# Patient Record
Sex: Female | Born: 1965 | Race: Black or African American | Hispanic: Refuse to answer | Marital: Married | Smoking: Never smoker
Health system: Southern US, Community
[De-identification: ages and names within clinical notes are randomized; demographics above are authoritative.]

## PROBLEM LIST (undated history)

## (undated) DIAGNOSIS — N938 Other specified abnormal uterine and vaginal bleeding: Secondary | ICD-10-CM

## (undated) DIAGNOSIS — Z8742 Personal history of other diseases of the female genital tract: Secondary | ICD-10-CM

## (undated) DIAGNOSIS — M545 Low back pain, unspecified: Secondary | ICD-10-CM

## (undated) DIAGNOSIS — Z8619 Personal history of other infectious and parasitic diseases: Secondary | ICD-10-CM

## (undated) DIAGNOSIS — N926 Irregular menstruation, unspecified: Secondary | ICD-10-CM

## (undated) DIAGNOSIS — N83201 Unspecified ovarian cyst, right side: Secondary | ICD-10-CM

## (undated) HISTORY — DX: Unspecified ovarian cyst, right side: N83.201

## (undated) HISTORY — DX: Personal history of other diseases of the female genital tract: Z87.42

## (undated) HISTORY — DX: Low back pain: M54.5

## (undated) HISTORY — DX: Personal history of other infectious and parasitic diseases: Z86.19

## (undated) HISTORY — DX: Low back pain, unspecified: M54.50

## (undated) HISTORY — PX: OTHER SURGICAL HISTORY: SHX169

## (undated) HISTORY — DX: Other specified abnormal uterine and vaginal bleeding: N93.8

## (undated) HISTORY — DX: Irregular menstruation, unspecified: N92.6

---

## 2009-02-27 ENCOUNTER — Ambulatory Visit: Payer: Self-pay | Admitting: Internal Medicine

## 2009-02-27 DIAGNOSIS — M545 Low back pain: Secondary | ICD-10-CM | POA: Insufficient documentation

## 2009-02-27 DIAGNOSIS — J309 Allergic rhinitis, unspecified: Secondary | ICD-10-CM | POA: Insufficient documentation

## 2009-03-03 ENCOUNTER — Ambulatory Visit: Payer: Self-pay | Admitting: Internal Medicine

## 2009-03-08 LAB — CONVERTED CEMR LAB
ALT: 18 units/L (ref 0–35)
AST: 21 units/L (ref 0–37)
Albumin: 3.9 g/dL (ref 3.5–5.2)
BUN: 12 mg/dL (ref 6–23)
Bilirubin Urine: NEGATIVE
Bilirubin, Direct: 0.1 mg/dL (ref 0.0–0.3)
CO2: 29 meq/L (ref 19–32)
Calcium: 9.3 mg/dL (ref 8.4–10.5)
Chloride: 104 meq/L (ref 96–112)
Cholesterol: 170 mg/dL (ref 0–200)
GFR calc Af Amer: 70 mL/min
GFR calc non Af Amer: 58 mL/min
HCT: 39 % (ref 36.0–46.0)
Ketones, ur: NEGATIVE mg/dL
Lymphocytes Relative: 45.4 % (ref 12.0–46.0)
MCHC: 33.9 g/dL (ref 30.0–36.0)
MCV: 90.7 fL (ref 78.0–100.0)
Neutro Abs: 2.9 10*3/uL (ref 1.4–7.7)
Neutrophils Relative %: 49.6 % (ref 43.0–77.0)
Potassium: 4 meq/L (ref 3.5–5.1)
Sodium: 139 meq/L (ref 135–145)
Total Bilirubin: 0.5 mg/dL (ref 0.3–1.2)
Total CHOL/HDL Ratio: 2.2
Total Protein: 6.7 g/dL (ref 6.0–8.3)
Triglycerides: 29 mg/dL (ref 0–149)
Urobilinogen, UA: 0.2 (ref 0.0–1.0)
VLDL: 6 mg/dL (ref 0–40)
pH: 7.5 (ref 5.0–8.0)

## 2009-04-20 ENCOUNTER — Telehealth: Payer: Self-pay | Admitting: Internal Medicine

## 2009-04-21 ENCOUNTER — Ambulatory Visit: Payer: Self-pay | Admitting: Internal Medicine

## 2009-04-21 DIAGNOSIS — R079 Chest pain, unspecified: Secondary | ICD-10-CM

## 2009-07-25 ENCOUNTER — Telehealth (INDEPENDENT_AMBULATORY_CARE_PROVIDER_SITE_OTHER): Payer: Self-pay | Admitting: *Deleted

## 2009-11-10 ENCOUNTER — Ambulatory Visit: Payer: Self-pay | Admitting: Internal Medicine

## 2009-11-10 DIAGNOSIS — N390 Urinary tract infection, site not specified: Secondary | ICD-10-CM

## 2009-11-13 LAB — CONVERTED CEMR LAB
Bilirubin Urine: NEGATIVE
Nitrite: POSITIVE
Urine Glucose: NEGATIVE mg/dL
Urobilinogen, UA: 0.2 (ref 0.0–1.0)
pH: 7 (ref 5.0–8.0)

## 2009-12-30 DIAGNOSIS — N938 Other specified abnormal uterine and vaginal bleeding: Secondary | ICD-10-CM

## 2009-12-30 DIAGNOSIS — N83201 Unspecified ovarian cyst, right side: Secondary | ICD-10-CM

## 2009-12-30 DIAGNOSIS — N926 Irregular menstruation, unspecified: Secondary | ICD-10-CM

## 2009-12-30 HISTORY — DX: Other specified abnormal uterine and vaginal bleeding: N93.8

## 2009-12-30 HISTORY — DX: Unspecified ovarian cyst, right side: N83.201

## 2009-12-30 HISTORY — DX: Irregular menstruation, unspecified: N92.6

## 2010-05-25 ENCOUNTER — Ambulatory Visit: Payer: Self-pay | Admitting: Internal Medicine

## 2010-05-25 LAB — CONVERTED CEMR LAB
AST: 24 units/L (ref 0–37)
Alkaline Phosphatase: 54 units/L (ref 39–117)
BUN: 14 mg/dL (ref 6–23)
Basophils Relative: 0.5 % (ref 0.0–3.0)
Bilirubin Urine: NEGATIVE
Bilirubin, Direct: 0.1 mg/dL (ref 0.0–0.3)
Creatinine, Ser: 1 mg/dL (ref 0.4–1.2)
Eosinophils Relative: 0.9 % (ref 0.0–5.0)
Glucose, Bld: 84 mg/dL (ref 70–99)
HCT: 36.2 % (ref 36.0–46.0)
Hemoglobin, Urine: NEGATIVE
Lymphs Abs: 2 10*3/uL (ref 0.7–4.0)
MCV: 90.2 fL (ref 78.0–100.0)
Neutro Abs: 3.9 10*3/uL (ref 1.4–7.7)
Nitrite: NEGATIVE
Platelets: 210 10*3/uL (ref 150.0–400.0)
Potassium: 4.8 meq/L (ref 3.5–5.1)
RBC: 4.01 M/uL (ref 3.87–5.11)
Sodium: 140 meq/L (ref 135–145)
Specific Gravity, Urine: 1.015 (ref 1.000–1.030)
TSH: 1.83 microintl units/mL (ref 0.35–5.50)
Total Bilirubin: 0.3 mg/dL (ref 0.3–1.2)
Total CHOL/HDL Ratio: 2
Total Protein: 5.9 g/dL — ABNORMAL LOW (ref 6.0–8.3)
Triglycerides: 39 mg/dL (ref 0.0–149.0)

## 2010-05-30 ENCOUNTER — Ambulatory Visit: Payer: Self-pay | Admitting: Internal Medicine

## 2010-05-30 DIAGNOSIS — Z780 Asymptomatic menopausal state: Secondary | ICD-10-CM | POA: Insufficient documentation

## 2010-07-10 ENCOUNTER — Encounter: Admission: RE | Admit: 2010-07-10 | Discharge: 2010-07-10 | Payer: Self-pay | Admitting: Obstetrics and Gynecology

## 2010-09-11 ENCOUNTER — Ambulatory Visit: Payer: Self-pay | Admitting: Internal Medicine

## 2010-09-11 DIAGNOSIS — N3090 Cystitis, unspecified without hematuria: Secondary | ICD-10-CM | POA: Insufficient documentation

## 2010-09-12 LAB — CONVERTED CEMR LAB
Specific Gravity, Urine: 1.015 (ref 1.000–1.030)
Urobilinogen, UA: 0.2 (ref 0.0–1.0)
pH: 7 (ref 5.0–8.0)

## 2010-12-06 ENCOUNTER — Telehealth: Payer: Self-pay | Admitting: Internal Medicine

## 2011-01-29 NOTE — Assessment & Plan Note (Signed)
Summary: blood in urine-lb   Vital Signs:  Patient profile:   45 year old female Height:      65.5 inches Weight:      161 pounds BMI:     26.48 Temp:     98.9 degrees F oral Pulse rate:   80 / minute Pulse rhythm:   regular Resp:     16 per minute BP sitting:   110 / 78  (left arm) Cuff size:   regular  Vitals Entered By: Lanier Prude, Beverly Gust) (September 11, 2010 8:44 AM) CC: hematuria & strange sensation prior to urination Is Patient Diabetic? No Comments pt is taking oral contraceptive 1qd  but is not sure of name  9  CC:  hematuria & strange sensation prior to urination.  History of Present Illness: C/o blood in urine x 3-4 d and discomfort C/o no urgency, fever, LBP LMP 1 wk ago  Current Medications (verified): 1)  Vitamin D3 1000 Unit  Tabs (Cholecalciferol) .Marland Kitchen.. 1 By Mouth Daily 2)  Naprosyn 500 Mg Tabs (Naproxen) .Marland Kitchen.. 1 Two Times A Day Pc As Needed X 1 Wk Then As Needed Pain 3)  Tretinoin 0.01 % Gel (Tretinoin) .... Use Once Daily On Face  Allergies (verified): 1)  * Dust  Physical Exam  General:  Well-developed,well-nourished,in no acute distress; alert,appropriate and cooperative throughout examination Mouth:  Oral mucosa and oropharynx without lesions or exudates.  Teeth in good repair. Abdomen:  Bowel sounds positive,abdomen soft and non-tender without masses, organomegaly or hernias noted. Msk:  LS NT Skin:  no rash   Impression & Recommendations:  Problem # 1:  CYSTITIS (ICD-595.9) Assessment New  Her updated medication list for this problem includes:    Ciprofloxacin Hcl 250 Mg Tabs (Ciprofloxacin hcl) .Marland Kitchen... 1 by mouth two times a day for cystitis  Orders: TLB-Udip ONLY (81003-UDIP)  Complete Medication List: 1)  Vitamin D3 1000 Unit Tabs (Cholecalciferol) .Marland Kitchen.. 1 by mouth daily 2)  Naprosyn 500 Mg Tabs (Naproxen) .Marland Kitchen.. 1 two times a day pc as needed x 1 wk then as needed pain 3)  Tretinoin 0.01 % Gel (Tretinoin) .... Use once daily on  face 4)  Ciprofloxacin Hcl 250 Mg Tabs (Ciprofloxacin hcl) .Marland Kitchen.. 1 by mouth two times a day for cystitis  Patient Instructions: 1)  Call if you are not better in a reasonable amount of time or if worse.  Prescriptions: CIPROFLOXACIN HCL 250 MG TABS (CIPROFLOXACIN HCL) 1 by mouth two times a day for cystitis  #10 x 0   Entered and Authorized by:   Tresa Garter MD   Signed by:   Tresa Garter MD on 09/11/2010   Method used:   Print then Give to Patient   RxID:   [id number]

## 2011-01-29 NOTE — Assessment & Plan Note (Signed)
Summary: CPX  /NWS   #   Vital Signs:  Patient profile:   45 year old female Height:      65.5 inches Weight:      165.50 pounds BMI:     27.22 O2 Sat:      97 % on Room air Temp:     98.4 degrees F oral Pulse rate:   91 / minute BP sitting:   120 / 72  (left arm) Cuff size:   regular  Vitals Entered By: Lucious Groves (May 30, 2010 10:49 AM)  O2 Flow:  Room air CC: CPX./kb Is Patient Diabetic? No Pain Assessment Patient in pain? no        CC:  CPX./kb.  History of Present Illness: The patient presents for a wellness examination  C/o irreg periods C/o irregular face pigmentation  Current Medications (verified): 1)  Vitamin D3 1000 Unit  Tabs (Cholecalciferol) .Marland Kitchen.. 1 By Mouth Daily 2)  Naprosyn 500 Mg Tabs (Naproxen) .Marland Kitchen.. 1 Two Times A Day Pc As Needed X 1 Wk Then As Needed Pain 3)  Diflucan 150 Mg Tabs (Fluconazole) .Marland Kitchen.. 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    Physical Exam  General:  Well-developed,well-nourished,in no acute distress; alert,appropriate  and cooperative throughout examination Head:  Normocephalic and atraumatic without obvious abnormalities. No apparent alopecia or balding. Eyes:  No corneal or conjunctival inflammation noted. EOMI. Perrla. Ears:  External ear exam shows no significant lesions or deformities.  Otoscopic examination reveals clear canals, tympanic membranes are intact bilaterally without bulging, retraction, inflammation or discharge. Hearing is grossly normal bilaterally. Nose:  External nasal examination shows no deformity or inflammation. Nasal mucosa are pink and moist without lesions or exudates. Mouth:  Oral mucosa and oropharynx without lesions or exudates.  Teeth in good repair. Neck:  No deformities, masses, or tenderness noted. Chest Wall:  No deformities, masses, or tenderness noted. Lungs:  Normal respiratory effort, chest expands symmetrically. Lungs are clear to auscultation, no crackles or wheezes. Heart:  Normal rate and regular rhythm. S1 and S2 normal without gallop, murmur, click, rub or other extra sounds. Abdomen:  Bowel sounds positive,abdomen soft and non-tender without masses, organomegaly or hernias noted. Msk:  No deformity or scoliosis noted of thoracic or lumbar spine.   Pulses:  R and L carotid,radial,femoral,dorsalis pedis and posterior tibial pulses are full and equal bilaterally Extremities:  No clubbing, cyanosis, edema, or deformity noted with normal full range of motion of all joints.   Neurologic:  No cranial nerve deficits noted. Station and gait are normal. Plantar reflexes are down-going bilaterally. DTRs are symmetrical throughout. Sensory, motor and coordinative functions appear intact. Skin:  Irreg hyperpigmentation of the face Cervical Nodes:  No lymphadenopathy noted Inguinal Nodes:  No significant adenopathy Psych:  Cognition and judgment appear intact. Alert and cooperative with normal attention span and concentration. No apparent delusions, illusions,  hallucinations   Impression & Recommendations:  Problem # 1:  WELL ADULT EXAM (ICD-V70.0) Assessment New The labs were reviewed with the patient.  Health and age related issues were discussed. Available screening tests and vaccinations were discussed as well. Healthy life style including good diet and execise was discussed.  Orders: EKG w/ Interpretation (93000) Tdap => 23yrs IM ([address]) Admin 1st Vaccine (60454) Admin 1st Vaccine (State) [phone]) HIV (-) PAP/mammo w/GYN  Problem # 2:  LOW BACK PAIN (ICD-724.2) Assessment: Improved Use stretching and balance exercises  Her updated medication list for this problem includes:    Naprosyn 500 Mg Tabs (Naproxen) .Marland Kitchen... 1 two times a day pc as needed x 1 wk then as needed pain  Problem # 3:  ALLERGIC RHINITIS (ICD-477.9) Assessment: Unchanged Loratidine prn  Problem # 4:  DYSMENORRHEA (ICD-625.3) Assessment: Deteriorated  Orders: Gynecologic Referral (Gyn)  Complete Medication List: 1)  Vitamin D3 1000 Unit Tabs (Cholecalciferol) .Marland Kitchen.. 1 by mouth daily 2)  Naprosyn 500 Mg Tabs (Naproxen) .Marland Kitchen.. 1 two times a day pc as needed x 1 wk then as needed pain 3)  Tretinoin 0.01 % Gel (Tretinoin) .... Use once daily on face  Patient Instructions: 1)  Please schedule a follow-up appointment in 1 year well w/labs. Prescriptions: TRETINOIN 0.01 % GEL (TRETINOIN) use once daily on face  #60 g x 6   Entered and Authorized by:   Tresa Garter MD   Signed by:   Tresa Garter MD on 05/30/2010   Method used:   Print then Give to Patient   RxID:   [id number]    Tetanus/Td Vaccine    Vaccine Type: Tdap    Site: left deltoid    Mfr: GlaxoSmithKline    Dose: 0.5 ml    Route: IM    Given by: Lucious Groves    Exp. Date: 03/23/2012    Lot #: [id number]    VIS given: 11/17/07 version given May 30, 2010.

## 2011-01-29 NOTE — Progress Notes (Signed)
Summary: REQ FOR RX   Phone Note Call from Patient Call back at [facility] Phone [phone]   Summary of Call: Pt c/o urinary frequency & pain, req rx for uti.  Initial call taken by: Lamar Sprinkles, CMA,  December 06, 2010 4:59 PM  Follow-up for Phone Call        ok Cipro OF if sick Follow-up by: Tresa Garter MD,  December 07, 2010 1:07 PM  Additional Follow-up for Phone Call Additional follow up Details #1::        left vm that we need pharm info & i would call back later Additional Follow-up by: Lamar Sprinkles, CMA,  December 07, 2010 2:37 PM    Additional Follow-up for Phone Call Additional follow up Details #2::    Pt informed  Follow-up by: Lamar Sprinkles, CMA,  December 07, 2010 6:16 PM  New/Updated Medications: CIPROFLOXACIN HCL 250 MG TABS (CIPROFLOXACIN HCL) 1 by mouth two times a day for cystitis Prescriptions: CIPROFLOXACIN HCL 250 MG TABS (CIPROFLOXACIN HCL) 1 by mouth two times a day for cystitis  #10 x 0   Entered by:   Lamar Sprinkles, CMA   Authorized by:   Tresa Garter MD   Signed by:   Lamar Sprinkles, CMA on 12/07/2010   Method used:   Electronically to        [facility] # [id number] (retail)       [address] [place], [place]  [address]       Ph: [phone]       Fax: [phone]   RxID:   [id number]

## 2011-03-06 ENCOUNTER — Encounter (INDEPENDENT_AMBULATORY_CARE_PROVIDER_SITE_OTHER): Payer: Self-pay | Admitting: *Deleted

## 2011-03-06 ENCOUNTER — Other Ambulatory Visit: Payer: Self-pay | Admitting: Internal Medicine

## 2011-03-06 ENCOUNTER — Telehealth: Payer: Self-pay | Admitting: Internal Medicine

## 2011-03-06 ENCOUNTER — Other Ambulatory Visit: Payer: BC Managed Care – PPO

## 2011-03-06 DIAGNOSIS — N3090 Cystitis, unspecified without hematuria: Secondary | ICD-10-CM

## 2011-03-06 LAB — URINALYSIS, ROUTINE W REFLEX MICROSCOPIC
Bilirubin Urine: NEGATIVE
Nitrite: NEGATIVE
Urobilinogen, UA: 0.2 (ref 0.0–1.0)
pH: 7 (ref 5.0–8.0)

## 2011-03-07 ENCOUNTER — Encounter: Payer: Self-pay | Admitting: Internal Medicine

## 2011-03-12 NOTE — Progress Notes (Signed)
Summary: REFILL CIPRO? /PLOT PT  Phone Note Call from Patient   Summary of Call: Patient is requesting refill of cipro. C/o recurrent UTI.  Initial call taken by: Lamar Sprinkles, CMA,  March 06, 2011 9:42 AM  Follow-up for Phone Call        come by for U/A 595.0 Rx based on results Follow-up by: Jacques Navy MD,  March 06, 2011 10:03 AM  Additional Follow-up for Phone Call Additional follow up Details #1::        Pt informed, hold phone note open for results  Additional Follow-up by: Lamar Sprinkles, CMA,  March 06, 2011 10:49 AM    Additional Follow-up for Phone Call Additional follow up Details #2::    MD informed of results and approved refill of previous RX. U/a also sent for culture. Patient is aware.....................Marland KitchenLamar Sprinkles, CMA  March 06, 2011 6:01 PM  Follow-up by: Jacques Navy MD,  March 06, 2011 6:25 PM  New/Updated Medications: CIPRO 250 MG TABS (CIPROFLOXACIN HCL) 1 two times a day Prescriptions: CIPRO 250 MG TABS (CIPROFLOXACIN HCL) 1 two times a day  #10 x 0   Entered by:   Lamar Sprinkles, CMA   Authorized by:   Tresa Garter MD   Signed by:   Lamar Sprinkles, CMA on 03/06/2011   Method used:   Electronically to        [facility] # [id number][facility] (retail)       [address] [place], [place]  [address]       Ph: [phone]       Fax: [phone]   RxID:   [id number]

## 2011-06-21 ENCOUNTER — Ambulatory Visit: Payer: BC Managed Care – PPO | Admitting: Internal Medicine

## 2011-07-24 ENCOUNTER — Ambulatory Visit (INDEPENDENT_AMBULATORY_CARE_PROVIDER_SITE_OTHER): Payer: BC Managed Care – PPO | Admitting: Internal Medicine

## 2011-07-24 ENCOUNTER — Encounter: Payer: Self-pay | Admitting: Internal Medicine

## 2011-07-24 ENCOUNTER — Telehealth: Payer: Self-pay | Admitting: *Deleted

## 2011-07-24 VITALS — BP 122/80 | HR 76 | Temp 99.5°F | Resp 16 | Ht 66.0 in | Wt 166.0 lb

## 2011-07-24 DIAGNOSIS — R5381 Other malaise: Secondary | ICD-10-CM

## 2011-07-24 DIAGNOSIS — R42 Dizziness and giddiness: Secondary | ICD-10-CM | POA: Insufficient documentation

## 2011-07-24 DIAGNOSIS — Z0000 Encounter for general adult medical examination without abnormal findings: Secondary | ICD-10-CM

## 2011-07-24 DIAGNOSIS — R5383 Other fatigue: Secondary | ICD-10-CM | POA: Insufficient documentation

## 2011-07-24 MED ORDER — METHYLPREDNISOLONE ACETATE 80 MG/ML IJ SUSP
120.0000 mg | Freq: Once | INTRAMUSCULAR | Status: AC
  Administered 2011-07-24: 120 mg via INTRAMUSCULAR

## 2011-07-24 MED ORDER — MECLIZINE HCL 12.5 MG PO TABS: 12.5000 mg | ORAL_TABLET | Freq: Three times a day (TID) | ORAL | Status: AC | PRN

## 2011-07-24 NOTE — Progress Notes (Signed)
  Subjective:    Patient ID: Belinda Johnson, female    DOB: 1966-09-11, 45 y.o.   MRN: [id number]  HPI  C/o severe dizzy spell 3 d ago in am. She felt dizzy, weak and nauseated after L head turn in bed. It hhas repeated a few times. Denies HA, fever, ST, earache etc.   Review of Systems  Constitutional: Negative for fever, chills, activity change, appetite change, fatigue and unexpected weight change.  HENT: Negative for hearing loss, ear pain, congestion, mouth sores, neck stiffness, sinus pressure and tinnitus.   Eyes: Negative for pain, itching and visual disturbance.  Respiratory: Negative for cough and chest tightness.   Gastrointestinal: Negative for nausea and abdominal pain.  Genitourinary: Negative for frequency, difficulty urinating and vaginal pain.  Musculoskeletal: Negative for back pain and gait problem.  Skin: Negative for pallor and rash.  Neurological: Positive for dizziness. Negative for tremors, syncope, speech difficulty, weakness, light-headedness, numbness and headaches.  Psychiatric/Behavioral: Negative for confusion and sleep disturbance.       Objective:   Physical Exam  Constitutional: She appears well-developed and well-nourished. No distress.  HENT:  Head: Normocephalic.  Right Ear: External ear normal.  Left Ear: External ear normal.  Nose: Nose normal.  Mouth/Throat: Oropharynx is clear and moist.  Eyes: Conjunctivae are normal. Pupils are equal, round, and reactive to light. Right eye exhibits no discharge. Left eye exhibits no discharge.  Neck: Normal range of motion. Neck supple. No JVD present. No tracheal deviation present. No thyromegaly present.  Cardiovascular: Normal rate, regular rhythm and normal heart sounds.   Pulmonary/Chest: No stridor. No respiratory distress. She has no wheezes.  Abdominal: Soft. Bowel sounds are normal. She exhibits no distension and no mass. There is no tenderness. There is no rebound and no guarding.    Musculoskeletal: She exhibits no edema and no tenderness.  Lymphadenopathy:    She has no cervical adenopathy.  Neurological: She displays normal reflexes. No cranial nerve deficit. She exhibits normal muscle tone. Coordination normal.       H-P is (++) on L  Skin: No rash noted. No erythema.  Psychiatric: She has a normal mood and affect. Her behavior is normal. Judgment and thought content normal.          Assessment & Plan:

## 2011-07-24 NOTE — Assessment & Plan Note (Addendum)
Will watch. HIV test at her request

## 2011-07-24 NOTE — Patient Instructions (Signed)
Benign Positional Vertigo symptoms on the left. Start Meclizine. Start Brandt - Daroff exercise several times a day as dirrected. 

## 2011-07-24 NOTE — Assessment & Plan Note (Addendum)
Benign Positional Vertigo symptoms on the left. Start Meclizine. Start Francee Piccolo - Daroff exercise several times a day as dirrected. Depo 120 mg IM No driving if dizzy Info re: BPV info given

## 2011-08-05 NOTE — Telephone Encounter (Signed)
error 

## 2011-08-27 ENCOUNTER — Other Ambulatory Visit: Payer: Self-pay | Admitting: *Deleted

## 2011-08-27 DIAGNOSIS — Z0000 Encounter for general adult medical examination without abnormal findings: Secondary | ICD-10-CM

## 2011-09-04 ENCOUNTER — Other Ambulatory Visit (INDEPENDENT_AMBULATORY_CARE_PROVIDER_SITE_OTHER): Payer: BC Managed Care – PPO

## 2011-09-04 ENCOUNTER — Other Ambulatory Visit: Payer: BC Managed Care – PPO

## 2011-09-04 DIAGNOSIS — Z0000 Encounter for general adult medical examination without abnormal findings: Secondary | ICD-10-CM

## 2011-09-04 LAB — BASIC METABOLIC PANEL
CO2: 30 mEq/L (ref 19–32)
Calcium: 9.3 mg/dL (ref 8.4–10.5)
Creatinine, Ser: 1.1 mg/dL (ref 0.4–1.2)
Potassium: 4.3 mEq/L (ref 3.5–5.1)
Sodium: 140 mEq/L (ref 135–145)

## 2011-09-04 LAB — URINALYSIS, ROUTINE W REFLEX MICROSCOPIC
Hgb urine dipstick: NEGATIVE
Ketones, ur: NEGATIVE
Nitrite: NEGATIVE

## 2011-09-04 LAB — CBC WITH DIFFERENTIAL/PLATELET
Basophils Absolute: 0 10*3/uL (ref 0.0–0.1)
Eosinophils Absolute: 0.1 10*3/uL (ref 0.0–0.7)
Lymphocytes Relative: 38.3 % (ref 12.0–46.0)
Lymphs Abs: 2.4 10*3/uL (ref 0.7–4.0)
MCHC: 33.4 g/dL (ref 30.0–36.0)
MCV: 90 fl (ref 78.0–100.0)
Monocytes Relative: 5.9 % (ref 3.0–12.0)
Neutro Abs: 3.3 10*3/uL (ref 1.4–7.7)
Neutrophils Relative %: 53.1 % (ref 43.0–77.0)
RBC: 4.16 Mil/uL (ref 3.87–5.11)
RDW: 13.7 % (ref 11.5–14.6)

## 2011-09-04 LAB — HEPATIC FUNCTION PANEL
ALT: 19 U/L (ref 0–35)
Total Bilirubin: 0.4 mg/dL (ref 0.3–1.2)

## 2011-09-04 LAB — LIPID PANEL: Total CHOL/HDL Ratio: 2

## 2011-09-11 ENCOUNTER — Encounter: Payer: Self-pay | Admitting: Internal Medicine

## 2011-09-11 ENCOUNTER — Other Ambulatory Visit: Payer: Self-pay | Admitting: Internal Medicine

## 2011-09-11 ENCOUNTER — Other Ambulatory Visit (INDEPENDENT_AMBULATORY_CARE_PROVIDER_SITE_OTHER): Payer: BC Managed Care – PPO

## 2011-09-11 ENCOUNTER — Ambulatory Visit (INDEPENDENT_AMBULATORY_CARE_PROVIDER_SITE_OTHER): Payer: BC Managed Care – PPO | Admitting: Internal Medicine

## 2011-09-11 ENCOUNTER — Other Ambulatory Visit (HOSPITAL_COMMUNITY)
Admission: RE | Admit: 2011-09-11 | Discharge: 2011-09-11 | Disposition: A | Discharge status: Home / Self Care | Payer: BC Managed Care – PPO | Source: Ambulatory Visit | Attending: Internal Medicine | Admitting: Internal Medicine

## 2011-09-11 VITALS — BP 128/90 | HR 72 | Temp 99.0°F | Resp 16 | Ht 66.0 in | Wt 169.0 lb

## 2011-09-11 DIAGNOSIS — R61 Generalized hyperhidrosis: Secondary | ICD-10-CM

## 2011-09-11 DIAGNOSIS — R5383 Other fatigue: Secondary | ICD-10-CM

## 2011-09-11 DIAGNOSIS — Z01419 Encounter for gynecological examination (general) (routine) without abnormal findings: Secondary | ICD-10-CM | POA: Insufficient documentation

## 2011-09-11 DIAGNOSIS — Z0000 Encounter for general adult medical examination without abnormal findings: Secondary | ICD-10-CM

## 2011-09-11 DIAGNOSIS — N760 Acute vaginitis: Secondary | ICD-10-CM

## 2011-09-11 DIAGNOSIS — M545 Low back pain: Secondary | ICD-10-CM

## 2011-09-11 LAB — HIV ANTIBODY (ROUTINE TESTING W REFLEX): HIV: NONREACTIVE

## 2011-09-11 LAB — WET PREP, GENITAL
Clue Cells Wet Prep HPF POC: NONE SEEN
Trich, Wet Prep: NONE SEEN

## 2011-09-11 LAB — FOLLICLE STIMULATING HORMONE: FSH: 101.7 m[IU]/mL

## 2011-09-11 NOTE — Progress Notes (Signed)
Subjective:    Patient ID: Belinda Johnson, female    DOB: [birth year], 45 y.o.   MRN: [id number]  HPI  The patient is here for a wellness exam. The patient has been doing well overall without major physical or psychological issues going on lately.  C/o repeat dizziness at times  C/o leg cramps at night sometimes   Review of Systems  Constitutional: Negative for fever, chills, diaphoresis, activity change, appetite change, fatigue and unexpected weight change.  HENT: Negative for hearing loss, ear pain, congestion, sore throat, sneezing, mouth sores, neck pain, dental problem, voice change, postnasal drip and sinus pressure.   Eyes: Negative for pain and visual disturbance.  Respiratory: Negative for cough, chest tightness, wheezing and stridor.   Cardiovascular: Negative for chest pain, palpitations and leg swelling.  Gastrointestinal: Negative for nausea, vomiting, abdominal pain, blood in stool, abdominal distention and rectal pain.  Genitourinary: Positive for menstrual problem. Negative for dysuria, hematuria, decreased urine volume, vaginal bleeding, vaginal discharge, difficulty urinating and vaginal pain.  Musculoskeletal: Positive for myalgias. Negative for back pain, joint swelling and gait problem.  Skin: Negative for color change, rash and wound.  Neurological: Negative for dizziness, tremors, syncope, speech difficulty and light-headedness.  Hematological: Negative for adenopathy.  Psychiatric/Behavioral: Negative for suicidal ideas, hallucinations, behavioral problems, confusion, sleep disturbance, dysphoric mood and decreased concentration. The patient is not hyperactive.    Wt Readings from Last 3 Encounters:  09/11/11 169 lb (76.658 kg)  07/24/11 166 lb (75.297 kg)  09/11/10 161 lb (73.029 kg)   BP Readings from Last 3 Encounters:  09/11/11 128/90  07/24/11 122/80  09/11/10 110/78        Objective:   Physical Exam  Constitutional: She appears well-developed  and well-nourished. No distress.  HENT:  Head: Normocephalic.  Right Ear: External ear normal.  Left Ear: External ear normal.  Nose: Nose normal.  Mouth/Throat: Oropharynx is clear and moist.  Eyes: Conjunctivae are normal. Pupils are equal, round, and reactive to light. Right eye exhibits no discharge. Left eye exhibits no discharge.  Neck: Normal range of motion. Neck supple. No JVD present. No tracheal deviation present. No thyromegaly present.  Cardiovascular: Normal rate, regular rhythm and normal heart sounds.   Pulmonary/Chest: Breath sounds normal. No stridor. No respiratory distress. She has no wheezes.       B breasts wnl  Abdominal: Soft. Bowel sounds are normal. She exhibits no distension and no mass. There is no tenderness. There is no rebound and no guarding.  Genitourinary: Uterus normal. Guaiac negative stool. Vaginal discharge found.  Musculoskeletal: She exhibits no edema and no tenderness.  Lymphadenopathy:    She has no cervical adenopathy.  Neurological: She displays normal reflexes. No cranial nerve deficit. She exhibits normal muscle tone. Coordination normal.  Skin: No rash noted. No erythema.  Psychiatric: She has a normal mood and affect. Her behavior is normal. Judgment and thought content normal.    Lab Results  Component Value Date   WBC 6.2 09/04/2011   HGB 12.5 09/04/2011   HCT 37.4 09/04/2011   PLT 218.0 09/04/2011   CHOL 172 09/04/2011   TRIG 89.0 09/04/2011   HDL 99.00 09/04/2011   ALT 19 09/04/2011   AST 21 09/04/2011   NA 140 09/04/2011   K 4.3 09/04/2011   CL 102 09/04/2011   CREATININE 1.1 09/04/2011   BUN 21 09/04/2011   CO2 30 09/04/2011   TSH 1.89 09/04/2011         Assessment &  Plan:

## 2011-09-11 NOTE — Assessment & Plan Note (Signed)
We discussed age appropriate health related issues, including available/recomended screening tests and vaccinations. We discussed a need for adhering to healthy diet and exercise. Labs/EKG were reviewed/ordered. All questions were answered.  PAP HIV  Irreg periods and hot flashes lately

## 2011-09-12 ENCOUNTER — Telehealth: Payer: Self-pay | Admitting: Internal Medicine

## 2011-09-12 NOTE — Telephone Encounter (Signed)
Belinda Johnson, please, inform patient that her [facility] test is c/w early menopause (it would explain sweats, not feeling well). HIV was negative. We can start a hormone replacement rx if she wishes: I can see her to discuss further after we get PAP results back. Thx

## 2011-09-12 NOTE — Telephone Encounter (Signed)
Left mess for patient to call back.  

## 2011-09-15 NOTE — Assessment & Plan Note (Signed)
2012 ?etiol check [facility]

## 2011-09-15 NOTE — Assessment & Plan Note (Signed)
Related to dizziness. ? Viral. Could be an early menopause/peri-menopause

## 2011-09-15 NOTE — Assessment & Plan Note (Signed)
NSAIDs prn Stretch

## 2011-09-19 NOTE — Telephone Encounter (Signed)
Pt informed. Will call her back with PAP results. Please advise

## 2011-09-19 NOTE — Telephone Encounter (Signed)
Results not ready as of 09-19-11.

## 2011-09-19 NOTE — Telephone Encounter (Signed)
Do we have PAP report? Thx

## 2011-09-24 MED ORDER — CLINDAMYCIN PHOSPHATE 2 % VA CREA: 1.0000 | TOPICAL_CREAM | Freq: Every day | VAGINAL | Status: AC

## 2011-09-24 NOTE — Telephone Encounter (Signed)
Noted. She can try Cleo vag cream - see rx Thx

## 2011-09-24 NOTE — Telephone Encounter (Signed)
PAP results ready. Results are negative. Pt informed. She states she still has discharge. Please advise on wet prep results. Does she need Rx for this?  Also, she does not wish to start HRT.

## 2011-09-25 NOTE — Telephone Encounter (Signed)
Pt informed

## 2011-11-06 ENCOUNTER — Telehealth: Payer: Self-pay | Admitting: *Deleted

## 2011-11-06 MED ORDER — CIPROFLOXACIN HCL 250 MG PO TABS: 250.0000 mg | ORAL_TABLET | Freq: Two times a day (BID) | ORAL | Status: AC

## 2011-11-06 NOTE — Telephone Encounter (Signed)
Request for Rx for recurrent UTI to [facility]--Pt states she is going out of town this morning & needs Rx by 10:30am

## 2011-11-06 NOTE — Telephone Encounter (Signed)
LMOM to Inform patient Rx sent to pharmacy.

## 2011-11-18 DIAGNOSIS — Z8742 Personal history of other diseases of the female genital tract: Secondary | ICD-10-CM

## 2011-11-18 HISTORY — DX: Personal history of other diseases of the female genital tract: Z87.42

## 2012-01-27 ENCOUNTER — Telehealth: Payer: Self-pay | Admitting: Internal Medicine

## 2012-01-27 NOTE — Telephone Encounter (Signed)
Thur 1pm Thx

## 2012-01-27 NOTE — Telephone Encounter (Signed)
CYST ON HAND ON THE JOINT.  SHE WOULD LIKE TO BE WORKED IN.  STARTED LAST WEEK.  THE SCHEDULE IS VERY FULL.  WHEN CAN SHE BE WORKED IN.

## 2012-01-30 ENCOUNTER — Encounter: Payer: Self-pay | Admitting: Internal Medicine

## 2012-01-30 ENCOUNTER — Ambulatory Visit (INDEPENDENT_AMBULATORY_CARE_PROVIDER_SITE_OTHER): Payer: BC Managed Care – PPO | Admitting: Internal Medicine

## 2012-01-30 DIAGNOSIS — M79609 Pain in unspecified limb: Secondary | ICD-10-CM

## 2012-01-30 DIAGNOSIS — M79642 Pain in left hand: Secondary | ICD-10-CM

## 2012-01-30 DIAGNOSIS — M6740 Ganglion, unspecified site: Secondary | ICD-10-CM

## 2012-01-30 MED ORDER — IBUPROFEN 600 MG PO TABS: ORAL_TABLET | ORAL | Status: AC

## 2012-01-30 NOTE — Progress Notes (Signed)
  Subjective:    Patient ID: Belinda Johnson, female    DOB: March 19, 1966, 46 y.o.   MRN: [id number]  HPI  C/o painful palm of her L hand x weeks, worse w/pressure  Review of Systems  Constitutional: Negative for fever.  Skin: Negative for rash.       Objective:   Physical Exam  Constitutional: She appears well-developed.  Musculoskeletal: She exhibits tenderness. She exhibits no edema.       Tender area at the distal 3d MCP  Neurological: She displays normal reflexes. She exhibits normal muscle tone. Coordination normal.  Skin: No rash noted.  Psychiatric: She has a normal mood and affect. Judgment normal.     Procedure Note :    Procedure :   Point of care (POC) sonography examination   Indication: pain in the L palm   Equipment used: Sonosite M-Turbo with HFL38x/13-6 MHz transducer linear probe. The images were stored in the unit and later transferred in storage.  The patient was placed in a sitting position.  This study revealed a hypoechoic small oval lesion over L 3d MCP   Impression: Ganglion cyst L palm        Assessment & Plan:

## 2012-01-30 NOTE — Assessment & Plan Note (Signed)
L 3d MCP Inject w/steroids was suggested and declined Ibuprofen Rx

## 2012-01-30 NOTE — Assessment & Plan Note (Signed)
Ibuprofen prn 

## 2012-01-30 NOTE — Patient Instructions (Signed)
Ganglion cyst

## 2012-03-28 ENCOUNTER — Emergency Department (HOSPITAL_COMMUNITY)
Admission: EM | Admit: 2012-03-28 | Discharge: 2012-03-28 | Disposition: A | Discharge status: Home / Self Care | Payer: BC Managed Care – PPO | Attending: Emergency Medicine | Admitting: Emergency Medicine

## 2012-03-28 ENCOUNTER — Emergency Department (HOSPITAL_COMMUNITY): Payer: BC Managed Care – PPO

## 2012-03-28 ENCOUNTER — Other Ambulatory Visit: Payer: Self-pay

## 2012-03-28 ENCOUNTER — Encounter (HOSPITAL_COMMUNITY): Payer: Self-pay | Admitting: Emergency Medicine

## 2012-03-28 DIAGNOSIS — R079 Chest pain, unspecified: Secondary | ICD-10-CM | POA: Insufficient documentation

## 2012-03-28 LAB — DIFFERENTIAL
Basophils Absolute: 0 10*3/uL (ref 0.0–0.1)
Eosinophils Relative: 1 % (ref 0–5)
Lymphocytes Relative: 50 % — ABNORMAL HIGH (ref 12–46)
Lymphs Abs: 2.7 10*3/uL (ref 0.7–4.0)
Neutro Abs: 2.3 10*3/uL (ref 1.7–7.7)

## 2012-03-28 LAB — BASIC METABOLIC PANEL
CO2: 28 mEq/L (ref 19–32)
Calcium: 9.5 mg/dL (ref 8.4–10.5)
Chloride: 106 mEq/L (ref 96–112)
Glucose, Bld: 90 mg/dL (ref 70–99)
Sodium: 142 mEq/L (ref 135–145)

## 2012-03-28 LAB — CBC
HCT: 35.6 % — ABNORMAL LOW (ref 36.0–46.0)
MCV: 88.3 fL (ref 78.0–100.0)
Platelets: 212 10*3/uL (ref 150–400)
RBC: 4.03 MIL/uL (ref 3.87–5.11)
WBC: 5.4 10*3/uL (ref 4.0–10.5)

## 2012-03-28 LAB — POCT I-STAT TROPONIN I: Troponin i, poc: 0 ng/mL (ref 0.00–0.08)

## 2012-03-28 LAB — D-DIMER, QUANTITATIVE: D-Dimer, Quant: <0.22 ug/mL-FEU (ref 0.00–0.48)

## 2012-03-28 MED ORDER — SUCRALFATE 1 G PO TABS
1.0000 g | ORAL_TABLET | ORAL | Status: AC
  Administered 2012-03-28: 1 g via ORAL
  Filled 2012-03-28 (×2): qty 1

## 2012-03-28 MED ORDER — ONDANSETRON HCL 4 MG/2ML IJ SOLN
4.0000 mg | Freq: Once | INTRAMUSCULAR | Status: AC
  Administered 2012-03-28: 4 mg via INTRAVENOUS
  Filled 2012-03-28: qty 2

## 2012-03-28 MED ORDER — GI COCKTAIL ~~LOC~~
30.0000 mL | Freq: Once | ORAL | Status: AC
  Administered 2012-03-28: 30 mL via ORAL
  Filled 2012-03-28: qty 30

## 2012-03-28 MED ORDER — OMEPRAZOLE 20 MG PO CPDR: 20.0000 mg | DELAYED_RELEASE_CAPSULE | Freq: Every day | ORAL | Status: DC

## 2012-03-28 MED ORDER — FENTANYL CITRATE 0.05 MG/ML IJ SOLN
50.0000 ug | Freq: Once | INTRAMUSCULAR | Status: AC
  Administered 2012-03-28: 50 ug via INTRAVENOUS
  Filled 2012-03-28: qty 2

## 2012-03-28 MED ORDER — FAMOTIDINE 20 MG PO TABS
20.0000 mg | ORAL_TABLET | Freq: Once | ORAL | Status: AC
  Administered 2012-03-28: 20 mg via ORAL
  Filled 2012-03-28: qty 1

## 2012-03-28 MED ORDER — ASPIRIN 81 MG PO CHEW
324.0000 mg | CHEWABLE_TABLET | Freq: Once | ORAL | Status: AC
  Administered 2012-03-28: 324 mg via ORAL
  Filled 2012-03-28: qty 4

## 2012-03-28 NOTE — ED Notes (Signed)
Pt. Stated, i started feeling something in my heart last night and still having the chest pain off and on , it feels like a something is hooking m in the chest.

## 2012-03-28 NOTE — ED Provider Notes (Signed)
Medical screening examination/treatment/procedure(s) were conducted as a shared visit with non-physician practitioner(s) and myself.  I personally evaluated the patient during the encounter  Patient here with epigastric discomfort radiating to her back. Symptoms last for seconds. They are not exertional. Suspect the patient has esophageal spasm as they do get worse with food..Doubt.patient has ACS. Patient had negative troponin and will be given GI cocktail and meds.  Toy Baker, MD 03/28/12 [phone]

## 2012-03-28 NOTE — ED Notes (Signed)
Pt returned from xray, placed back on monitor.

## 2012-03-28 NOTE — ED Provider Notes (Signed)
History     CSN: [id number]  Arrival date & time 03/28/12  1134   First MD Initiated Contact with Patient 03/28/12 1147      Chief Complaint  Patient presents with  . Chest Pain    (Consider location/radiation/quality/duration/timing/severity/associated sxs/prior treatment) HPI  Patient presents to emergency department complaining of chest pain. Patient states that last night before bed she was relaxing at home and felt very mild discomfort in her substernal region. Patient states she ignored pain and went to bed. Patient states that she woke this morning pain-free and did errands around the house and went to the gym to workout. Patient states that about midway for her workout she began to have some mild discomfort similar to last night but was able to ignore and continue her workout. Patient states she left the gym and returned home pain free but once again began to have increase in severity of pain. Patient states pain will come and go, it is very intermittent. Patient states the pain will last for a few minutes and is described as an ache and then resolve. Patient states that each time the pain recurs it seems to be getting stronger and more severe. Patient states that she'll go several minutes without any pain before she feels another few minutes of aching. Patient denies any associated nausea, vomiting, abdominal pain, diaphoresis. Patient states pain radiates straight into back. Patient states she has no known medical problems and takes supplemental vitamin D daily but no other daily medications. Patient is followed by Dr. Posey Rea as her primary care physician. Patient denies any family history of early heart disease or heart attack. Patient denies alcohol, tobacco, or illicit drug use. Patient has taken nothing for pain prior to arrival. Patient denies aggravating or alleviating factors. She denies any recent illness or cough. She denies hemoptysis, lower extremity pain or swelling, or  history of PE or DVT.  History reviewed. No pertinent past medical history.  History reviewed. No pertinent past surgical history.  No family history on file.  History  Substance Use Topics  . Smoking status: Not on file  . Smokeless tobacco: Not on file  . Alcohol Use: 0.6 oz/week    1 Glasses of wine per week     occassional    OB History    Grav Para Term Preterm Abortions TAB SAB Ect Mult Living                  Review of Systems  All other systems reviewed and are negative.    Allergies  Dust mite extract; Latex; and Quinine derivatives  Home Medications   Current Outpatient Rx  Name Route Sig Dispense Refill  . VITAMIN D 1000 UNITS PO TABS Oral Take 1,000 Units by mouth daily.      BP 129/95  Pulse 76  Temp(Src) 98.3 F (36.8 C) (Oral)  Resp 20  SpO2 100%  Physical Exam  Nursing note and vitals reviewed. Constitutional: She is oriented to person, place, and time. She appears well-developed and well-nourished. No distress.  HENT:  Head: Normocephalic and atraumatic.  Eyes: Conjunctivae are normal.  Neck: Normal range of motion. Neck supple.  Cardiovascular: Normal rate, regular rhythm, normal heart sounds and intact distal pulses.  Exam reveals no gallop and no friction rub.   No murmur heard. Pulmonary/Chest: Effort normal and breath sounds normal. No respiratory distress. She has no wheezes. She has no rales. She exhibits no tenderness.  Abdominal: Bowel sounds are normal. She  exhibits no distension and no mass. There is no tenderness. There is no rebound and no guarding.  Musculoskeletal: Normal range of motion. She exhibits no edema and no tenderness.  Neurological: She is alert and oriented to person, place, and time.  Skin: Skin is warm and dry. No rash noted. She is not diaphoretic. No erythema.  Psychiatric: She has a normal mood and affect.    ED Course  Procedures (including critical care time)  PO aspirin, IV fentanyl and zofran    Date: 03/28/2012  Rate: 85  Rhythm: normal sinus rhythm  QRS Axis: normal  Intervals: normal  ST/T Wave abnormalities: normal  Conduction Disutrbances: none  Narrative Interpretation:   Old EKG Reviewed: none for comparison   Labs Reviewed  CBC - Abnormal; Notable for the following:    Hemoglobin 11.9 (*)    HCT 35.6 (*)    All other components within normal limits  DIFFERENTIAL - Abnormal; Notable for the following:    Lymphocytes Relative 50 (*)    All other components within normal limits  BASIC METABOLIC PANEL - Abnormal; Notable for the following:    Creatinine, Ser 1.12 (*)    GFR calc non Af Amer 59 (*)    GFR calc Af Amer 68 (*)    All other components within normal limits  D-DIMER, QUANTITATIVE  POCT I-STAT TROPONIN I   Dg Chest 2 View  03/28/2012  *RADIOLOGY REPORT*  Clinical Data: Chest pain.  CHEST - 2 VIEW  Comparison: No priors.  Findings: Lung volumes are normal.  No consolidative airspace disease.  No pleural effusions.  No pneumothorax.  No pulmonary nodule or mass noted.  Pulmonary vasculature and the cardiomediastinal silhouette are within normal limits.  IMPRESSION: 1. No radiographic evidence of acute cardiopulmonary disease.  Original Report Authenticated By: Florencia Reasons, M.D.     1. Chest pain       MDM  Patient is very low risk for acute coronary syndrome and pulmonary embolism with a negative d-dimer, negative troponin, in no acute findings on labs or chest x-ray. Spoke with patient about her mild elevation of creatinine and for close followup with her primary care physician for recheck as well as for further evaluation and management of ongoing chest discomfort however spoke at length with patient about worrisome signs or symptoms that should prompt return to ER. Patient is agreeable plan and voices her understanding.        Jenness Corner, [place] 03/28/12 1501

## 2012-03-28 NOTE — Discharge Instructions (Signed)
Take Prilosec as directed to decrease acid in your stomach and any potential reflux into your chest causing chest discomfort. Alternate between Tylenol and ibuprofen for additional pain relief. Followup with Dr. Posey Rea of next week for further evaluation and management of ongoing symptoms but returned to emergency department for any changing or worsening symptoms as discussed.  Chest Pain (Nonspecific) It is often hard to give a specific diagnosis for the cause of chest pain. There is always a chance that your pain could be related to something serious, such as a heart attack or a blood clot in the lungs. You need to follow up with your caregiver for further evaluation. CAUSES   Heartburn.   Pneumonia or bronchitis.   Anxiety or stress.   Inflammation around your heart (pericarditis) or lung (pleuritis or pleurisy).   A blood clot in the lung.   A collapsed lung (pneumothorax). It can develop suddenly on its own (spontaneous pneumothorax) or from injury (trauma) to the chest.   Shingles infection (herpes zoster virus).  The chest wall is composed of bones, muscles, and cartilage. Any of these can be the source of the pain.  The bones can be bruised by injury.   The muscles or cartilage can be strained by coughing or overwork.   The cartilage can be affected by inflammation and become sore (costochondritis).  DIAGNOSIS  Lab tests or other studies, such as X-rays, electrocardiography, stress testing, or cardiac imaging, may be needed to find the cause of your pain.  TREATMENT   Treatment depends on what may be causing your chest pain. Treatment may include:   Acid blockers for heartburn.   Anti-inflammatory medicine.   Pain medicine for inflammatory conditions.   Antibiotics if an infection is present.   You may be advised to change lifestyle habits. This includes stopping smoking and avoiding alcohol, caffeine, and chocolate.   You may be advised to keep your head raised  (elevated) when sleeping. This reduces the chance of acid going backward from your stomach into your esophagus.   Most of the time, nonspecific chest pain will improve within 2 to 3 days with rest and mild pain medicine.  HOME CARE INSTRUCTIONS   If antibiotics were prescribed, take your antibiotics as directed. Finish them even if you start to feel better.   For the next few days, avoid physical activities that bring on chest pain. Continue physical activities as directed.   Do not smoke.   Avoid drinking alcohol.   Only take over-the-counter or prescription medicine for pain, discomfort, or fever as directed by your caregiver.   Follow your caregiver's suggestions for further testing if your chest pain does not go away.   Keep any follow-up appointments you made. If you do not go to an appointment, you could develop lasting (chronic) problems with pain. If there is any problem keeping an appointment, you must call to reschedule.  SEEK MEDICAL CARE IF:   You think you are having problems from the medicine you are taking. Read your medicine instructions carefully.   Your chest pain does not go away, even after treatment.   You develop a rash with blisters on your chest.  SEEK IMMEDIATE MEDICAL CARE IF:   You have increased chest pain or pain that spreads to your arm, neck, jaw, back, or abdomen.   You develop shortness of breath, an increasing cough, or you are coughing up blood.   You have severe back or abdominal pain, feel nauseous, or vomit.  You develop severe weakness, fainting, or chills.   You have a fever.  THIS IS AN EMERGENCY. Do not wait to see if the pain will go away. Get medical help at once. Call your local emergency services (911 in [place] Do not drive yourself to the hospital. MAKE SURE YOU:   Understand these instructions.   Will watch your condition.   Will get help right away if you are not doing well or get worse.  Document Released: 09/25/2005  Document Revised: 12/05/2011 Document Reviewed: 07/21/2008 [facility] Patient Information 2012 [place], [place].

## 2012-03-30 ENCOUNTER — Encounter: Payer: Self-pay | Admitting: Internal Medicine

## 2012-03-31 NOTE — ED Provider Notes (Signed)
Medical screening examination/treatment/procedure(s) were conducted as a shared visit with non-physician practitioner(s) and myself.  I personally evaluated the patient during the encounter  Robbie Nangle T Betzaida Cremeens, MD 03/31/12 1542 

## 2012-12-29 ENCOUNTER — Ambulatory Visit (INDEPENDENT_AMBULATORY_CARE_PROVIDER_SITE_OTHER): Payer: BC Managed Care – PPO | Admitting: Internal Medicine

## 2012-12-29 ENCOUNTER — Encounter: Payer: Self-pay | Admitting: Internal Medicine

## 2012-12-29 ENCOUNTER — Other Ambulatory Visit (INDEPENDENT_AMBULATORY_CARE_PROVIDER_SITE_OTHER): Payer: BC Managed Care – PPO

## 2012-12-29 VITALS — BP 122/80 | HR 68 | Temp 97.7°F | Ht 65.0 in | Wt 170.2 lb

## 2012-12-29 DIAGNOSIS — R202 Paresthesia of skin: Secondary | ICD-10-CM | POA: Insufficient documentation

## 2012-12-29 DIAGNOSIS — R209 Unspecified disturbances of skin sensation: Secondary | ICD-10-CM

## 2012-12-29 DIAGNOSIS — R079 Chest pain, unspecified: Secondary | ICD-10-CM

## 2012-12-29 DIAGNOSIS — R0789 Other chest pain: Secondary | ICD-10-CM

## 2012-12-29 DIAGNOSIS — M79642 Pain in left hand: Secondary | ICD-10-CM

## 2012-12-29 DIAGNOSIS — Z0000 Encounter for general adult medical examination without abnormal findings: Secondary | ICD-10-CM

## 2012-12-29 DIAGNOSIS — M79609 Pain in unspecified limb: Secondary | ICD-10-CM

## 2012-12-29 LAB — CBC WITH DIFFERENTIAL/PLATELET
Basophils Absolute: 0 10*3/uL (ref 0.0–0.1)
Eosinophils Relative: 1.2 % (ref 0.0–5.0)
HCT: 39.4 % (ref 36.0–46.0)
Hemoglobin: 13.4 g/dL (ref 12.0–15.0)
Lymphocytes Relative: 47.8 % — ABNORMAL HIGH (ref 12.0–46.0)
Lymphs Abs: 2.8 10*3/uL (ref 0.7–4.0)
Monocytes Relative: 6.6 % (ref 3.0–12.0)
Neutro Abs: 2.6 10*3/uL (ref 1.4–7.7)
RBC: 4.5 Mil/uL (ref 3.87–5.11)
RDW: 12.9 % (ref 11.5–14.6)
WBC: 5.9 10*3/uL (ref 4.5–10.5)

## 2012-12-29 LAB — LIPID PANEL
LDL Cholesterol: 88 mg/dL (ref 0–99)
Total CHOL/HDL Ratio: 2
Triglycerides: 56 mg/dL (ref 0.0–149.0)

## 2012-12-29 LAB — URINALYSIS
Bilirubin Urine: NEGATIVE
Hgb urine dipstick: NEGATIVE
Ketones, ur: NEGATIVE
Nitrite: NEGATIVE
Total Protein, Urine: NEGATIVE
Urine Glucose: NEGATIVE
pH: 7.5 (ref 5.0–8.0)

## 2012-12-29 LAB — HEPATIC FUNCTION PANEL
ALT: 21 U/L (ref 0–35)
AST: 25 U/L (ref 0–37)
Alkaline Phosphatase: 60 U/L (ref 39–117)
Bilirubin, Direct: 0.1 mg/dL (ref 0.0–0.3)
Total Bilirubin: 0.7 mg/dL (ref 0.3–1.2)
Total Protein: 6.8 g/dL (ref 6.0–8.3)

## 2012-12-29 LAB — BASIC METABOLIC PANEL
CO2: 30 mEq/L (ref 19–32)
Chloride: 103 mEq/L (ref 96–112)
Creatinine, Ser: 1.2 mg/dL (ref 0.4–1.2)
Glucose, Bld: 87 mg/dL (ref 70–99)
Sodium: 140 mEq/L (ref 135–145)

## 2012-12-29 MED ORDER — NAPROXEN 500 MG PO TABS: 500.0000 mg | ORAL_TABLET | Freq: Two times a day (BID) | ORAL | Status: DC

## 2012-12-29 NOTE — Progress Notes (Signed)
Subjective:    Chest Pain  Pertinent negatives include no abdominal pain, back pain, cough, diaphoresis, dizziness, fever, nausea, palpitations or vomiting.   C/o R hand weak and going to sleep x 5 d after they drove to [facility] repeat dizziness at times  C/o leg cramps at night sometimes   Review of Systems  Constitutional: Negative for fever, chills, diaphoresis, activity change, appetite change, fatigue and unexpected weight change.  HENT: Negative for hearing loss, ear pain, congestion, sore throat, sneezing, mouth sores, neck pain, dental problem, voice change, postnasal drip and sinus pressure.   Eyes: Negative for pain and visual disturbance.  Respiratory: Negative for cough, chest tightness, wheezing and stridor.   Cardiovascular: Positive for chest pain. Negative for palpitations and leg swelling.  Gastrointestinal: Negative for nausea, vomiting, abdominal pain, blood in stool, abdominal distention and rectal pain.  Genitourinary: Positive for menstrual problem. Negative for dysuria, hematuria, decreased urine volume, vaginal bleeding, vaginal discharge, difficulty urinating and vaginal pain.  Musculoskeletal: Positive for myalgias. Negative for back pain, joint swelling and gait problem.  Skin: Negative for color change, rash and wound.  Neurological: Negative for dizziness, tremors, syncope, speech difficulty and light-headedness.  Hematological: Negative for adenopathy.  Psychiatric/Behavioral: Negative for suicidal ideas, hallucinations, behavioral problems, confusion, sleep disturbance, dysphoric mood and decreased concentration. The patient is not hyperactive.    Wt Readings from Last 3 Encounters:  12/29/12 170 lb 4 oz (77.225 kg)  01/30/12 169 lb (76.658 kg)  09/11/11 169 lb (76.658 kg)   BP Readings from Last 3 Encounters:  12/29/12 122/80  03/28/12 134/89  01/30/12 120/88        Objective:   Physical Exam  Constitutional: She appears well-developed  and well-nourished. No distress.  HENT:  Head: Normocephalic.  Right Ear: External ear normal.  Left Ear: External ear normal.  Nose: Nose normal.  Mouth/Throat: Oropharynx is clear and moist.  Eyes: Conjunctivae normal are normal. Pupils are equal, round, and reactive to light. Right eye exhibits no discharge. Left eye exhibits no discharge.  Neck: Normal range of motion. Neck supple. No JVD present. No tracheal deviation present. No thyromegaly present.  Cardiovascular: Normal rate, regular rhythm and normal heart sounds.   Pulmonary/Chest: Breath sounds normal. No stridor. No respiratory distress. She has no wheezes.       B breasts wnl  Abdominal: Soft. Bowel sounds are normal. She exhibits no distension and no mass. There is no tenderness. There is no rebound and no guarding.  Genitourinary: Uterus normal. Guaiac negative stool. Vaginal discharge found.  Musculoskeletal: She exhibits no edema and no tenderness.  Lymphadenopathy:    She has no cervical adenopathy.  Neurological: She displays normal reflexes. No cranial nerve deficit. She exhibits normal muscle tone. Coordination normal.  Skin: No rash noted. No erythema.  Psychiatric: She has a normal mood and affect. Her behavior is normal. Judgment and thought content normal.   Neg CTS tests MS 5/5 B  Lab Results  Component Value Date   WBC 5.4 03/28/2012   HGB 11.9* 03/28/2012   HCT 35.6* 03/28/2012   PLT 212 03/28/2012   CHOL 172 09/04/2011   TRIG 89.0 09/04/2011   HDL 99.00 09/04/2011   ALT 19 09/04/2011   AST 21 09/04/2011   NA 142 03/28/2012   K 3.9 03/28/2012   CL 106 03/28/2012   CREATININE 1.12* 03/28/2012   BUN 14 03/28/2012   CO2 28 03/28/2012   TSH 1.89 09/04/2011  Assessment & Plan:

## 2012-12-29 NOTE — Assessment & Plan Note (Signed)
Better I can't palpate the cyst today

## 2012-12-29 NOTE — Assessment & Plan Note (Signed)
Nl EKG Call if problems

## 2012-12-29 NOTE — Assessment & Plan Note (Signed)
12/13 R hand - poss new CTS vs overuse Splint Naproxen

## 2013-11-03 ENCOUNTER — Ambulatory Visit (INDEPENDENT_AMBULATORY_CARE_PROVIDER_SITE_OTHER): Payer: BC Managed Care – PPO | Admitting: Internal Medicine

## 2013-11-03 ENCOUNTER — Encounter: Payer: Self-pay | Admitting: Internal Medicine

## 2013-11-03 VITALS — BP 100/78 | HR 76 | Temp 98.5°F | Resp 16 | Wt 170.0 lb

## 2013-11-03 DIAGNOSIS — D485 Neoplasm of uncertain behavior of skin: Secondary | ICD-10-CM

## 2013-11-03 DIAGNOSIS — J309 Allergic rhinitis, unspecified: Secondary | ICD-10-CM

## 2013-11-03 DIAGNOSIS — L299 Pruritus, unspecified: Secondary | ICD-10-CM

## 2013-11-03 MED ORDER — METHYLPREDNISOLONE ACETATE 80 MG/ML IJ SUSP
80.0000 mg | Freq: Once | INTRAMUSCULAR | Status: AC
  Administered 2013-11-03: 80 mg via INTRAMUSCULAR

## 2013-11-03 MED ORDER — TRIAMCINOLONE ACETONIDE 0.5 % EX CREA: 1.0000 "application " | TOPICAL_CREAM | Freq: Three times a day (TID) | CUTANEOUS | Status: DC

## 2013-11-03 MED ORDER — CETIRIZINE HCL 10 MG PO TABS: 10.0000 mg | ORAL_TABLET | Freq: Every day | ORAL | Status: DC | PRN

## 2013-11-03 NOTE — Patient Instructions (Signed)
Gluten free trial (no wheat products) for 4-6 weeks. OK to use gluten-free bread and gluten-free pasta.  Milk free trial (no milk, ice cream, cheese and yogurt) for 4-6 weeks. OK to use almond, coconut, rice or soy milk. "Almond breeze" brand tastes good.  

## 2013-11-03 NOTE — Progress Notes (Signed)
Pre-visit discussion using our clinic review tool. No additional management support is needed unless otherwise documented below in the visit note.  

## 2013-11-03 NOTE — Assessment & Plan Note (Addendum)
Recurrent ?etiology Depomedrol im Avoid metal exposure Triamcinolone cream Rx

## 2013-11-04 NOTE — Progress Notes (Signed)
Subjective:  C/o skin itching  Rash This is a new problem. The current episode started in the past 7 days. The problem has been gradually worsening since onset. The affected locations include the face, chest and torso. Pertinent negatives include no congestion, eye pain, fatigue or sore throat. Past treatments include anti-itch cream. The treatment provided no relief. Her past medical history is significant for allergies. 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  HENT: Negative for congestion, dental problem, ear pain, hearing loss, mouth sores, postnasal drip, sinus pressure, sneezing, sore throat and voice change.   Eyes: Negative for pain and visual disturbance.  Respiratory: Negative for chest tightness, wheezing and stridor.   Cardiovascular: Negative for leg swelling.  Gastrointestinal: Negative for blood in stool, abdominal distention and rectal pain.  Genitourinary: Positive for menstrual problem. Negative for dysuria, hematuria, decreased urine volume, vaginal bleeding, vaginal discharge, difficulty urinating and vaginal pain.  Musculoskeletal: Positive for myalgias. Negative for gait problem, joint swelling and neck pain.  Skin: Negative for color change, rash and wound.  Neurological: Negative for tremors, syncope, speech difficulty and light-headedness.  Hematological: Negative for adenopathy.  Psychiatric/Behavioral: Negative for suicidal ideas, hallucinations, behavioral problems, confusion, sleep disturbance, dysphoric mood and decreased concentration. The patient is not hyperactive.    Wt Readings from Last 3 Encounters:  11/03/13 170 lb (77.111 kg)  12/29/12 170 lb 4 oz (77.225 kg)  01/30/12 169 lb (76.658 kg)   BP Readings from Last 3 Encounters:  11/03/13 100/78  12/29/12 122/80  03/28/12 134/89        Objective:   Physical Exam  Constitutional:  She appears well-developed and well-nourished. No distress.  HENT:  Head: Normocephalic.  Right Ear: External ear normal.  Left Ear: External ear normal.  Nose: Nose normal.  Mouth/Throat: Oropharynx is clear and moist.  Eyes: Conjunctivae are normal. Pupils are equal, round, and reactive to light. Right eye exhibits no discharge. Left eye exhibits no discharge.  Neck: Normal range of motion. Neck supple. No JVD present. No tracheal deviation present. No thyromegaly present.  Cardiovascular: Normal rate, regular rhythm and normal heart sounds.   Pulmonary/Chest: Breath sounds normal. No stridor. No respiratory distress. She has no wheezes.  B breasts wnl  Abdominal: Soft. Bowel sounds are normal. She exhibits no distension and no mass. There is no tenderness. There is no rebound and no guarding.  Genitourinary: Uterus normal. Guaiac negative stool. Vaginal discharge found.  Musculoskeletal: She exhibits no edema and no tenderness.  Lymphadenopathy:    She has no cervical adenopathy.  Neurological: She displays normal reflexes. No cranial nerve deficit. She exhibits normal muscle tone. Coordination normal.  Skin: No rash noted. No erythema.  Psychiatric: She has a normal mood and affect. Her behavior is normal. Judgment and thought content normal.  Hyperpigmented rash on face, neck and chest L eyelid papilloma  Neg CTS tests MS 5/5 B  Lab Results  Component Value Date   WBC 5.9 12/29/2012   HGB 13.4 12/29/2012   HCT 39.4 12/29/2012   PLT 233.0 12/29/2012   CHOL 173 12/29/2012   TRIG 56.0 12/29/2012   HDL 74.00 12/29/2012   ALT 21 12/29/2012   AST 25 12/29/2012   NA 140 12/29/2012   K 4.1 12/29/2012   CL 103 12/29/2012   CREATININE 1.2 12/29/2012   BUN 16 12/29/2012   CO2 30 12/29/2012   TSH 1.61  12/29/2012         Assessment & Plan:

## 2013-11-04 NOTE — Assessment & Plan Note (Signed)
Zyrtec  

## 2013-11-15 ENCOUNTER — Ambulatory Visit: Payer: Self-pay | Admitting: Internal Medicine

## 2013-12-06 ENCOUNTER — Ambulatory Visit: Payer: Self-pay | Admitting: Internal Medicine

## 2014-10-31 ENCOUNTER — Encounter: Payer: Self-pay | Admitting: Internal Medicine

## 2014-11-30 ENCOUNTER — Ambulatory Visit (INDEPENDENT_AMBULATORY_CARE_PROVIDER_SITE_OTHER): Payer: BC Managed Care – PPO | Admitting: Internal Medicine

## 2014-11-30 ENCOUNTER — Encounter: Payer: Self-pay | Admitting: Internal Medicine

## 2014-11-30 VITALS — BP 118/80 | HR 78 | Temp 98.9°F | Ht 66.0 in | Wt 176.0 lb

## 2014-11-30 DIAGNOSIS — M545 Low back pain, unspecified: Secondary | ICD-10-CM | POA: Insufficient documentation

## 2014-11-30 MED ORDER — NAPROXEN 500 MG PO TABS: 500.0000 mg | ORAL_TABLET | Freq: Two times a day (BID) | ORAL | Status: DC

## 2014-11-30 MED ORDER — TIZANIDINE HCL 4 MG PO TABS: 4.0000 mg | ORAL_TABLET | Freq: Four times a day (QID) | ORAL | Status: DC | PRN

## 2014-11-30 MED ORDER — TRAMADOL HCL 50 MG PO TABS: 50.0000 mg | ORAL_TABLET | Freq: Four times a day (QID) | ORAL | Status: DC | PRN

## 2014-11-30 NOTE — Progress Notes (Signed)
Pre visit review using our clinic review tool, if applicable. No additional management support is needed unless otherwise documented below in the visit note. 

## 2014-11-30 NOTE — Progress Notes (Signed)
   Subjective:    Patient ID: Belinda Johnson, female    DOB: 31-Aug-1966, 48 y.o.   MRN: [id number]  HPI  Here to f/u with acute c/o lower back pain x 1 day;  Started after lifting heavy object, mod to severe, constant, dull with sharp pains with standing or bending/walking, No bowel or bladder change, fever, wt loss,  worsening LE pain/numbness/weakness, gait change or falls. Past Medical History  Diagnosis Date  . LBP (low back pain)   . Allergic rhinitis   . H/O varicella   . Irregular periods/menstrual cycles 2011  . Ovarian cyst, right 2011  . DUB (dysfunctional uterine bleeding) 2011  . H/O: menorrhagia 11/18/11   No past surgical history on file.  reports that she has never smoked. She does not have any smokeless tobacco history on file. She reports that she drinks about 0.6 oz of alcohol per week. She reports that she does not use illicit drugs. family history includes Hypertension in her mother and other; Osteoarthritis in her mother. Allergies  Allergen Reactions  . Dust Mite Extract Other (See Comments)    Airway constriction  . Latex Itching  . Quinine Derivatives   . Quinine Derivatives Itching   Current Outpatient Prescriptions on File Prior to Visit  Medication Sig Dispense Refill  . cetirizine (ZYRTEC) 10 MG tablet Take 1 tablet (10 mg total) by mouth daily as needed for allergies. 30 tablet 3  . cholecalciferol (VITAMIN D) 1000 UNITS tablet Take 1,000 Units by mouth daily.    Marland Kitchen triamcinolone cream (KENALOG) 0.5 % Apply 1 application topically 3 (three) times daily. 45 g 1  . omeprazole (PRILOSEC) 20 MG capsule Take 1 capsule (20 mg total) by mouth daily. 14 capsule 0   No current facility-administered medications on file prior to visit.   Review of Systems All otherwise neg per pt     Objective:   Physical Exam BP 118/80 mmHg  Pulse 78  Temp(Src) 98.9 F (37.2 C) (Oral)  Ht 5\' 6"  (1.676 m)  Wt 176 lb (79.833 kg)  BMI 28.42 kg/m2  SpO2 95% VS noted,    Constitutional: Pt appears well-developed, well-nourished.  HENT: Head: NCAT.  Right Ear: External ear normal.  Left Ear: External ear normal.  Eyes: . Pupils are equal, round, and reactive to light. Conjunctivae and EOM are normal Neck: Normal range of motion. Neck supple.  Cardiovascular: Normal rate and regular rhythm.   Pulmonary/Chest: Effort normal and breath sounds normal.  Abd:  Soft, NT, ND, + BS Neurological: Pt is alert. Not confused , motor grossly intact Skin: Skin is warm. No rash Psychiatric: Pt behavior is normal. No agitation.  Spine nontender Has marked right and left lumbar paravetebral tender/spasm right > left    Assessment & Plan:

## 2014-11-30 NOTE — Patient Instructions (Signed)
Please take all new medication as prescribed - the pain medication, and muscle relaxers  Please continue all other medications as before, and refills have been done if requested.  Please have the pharmacy call with any other refills you may need.  Please keep your appointments with your specialists as you may have planned

## 2014-11-30 NOTE — Assessment & Plan Note (Signed)
C/w severe muscular spasm by exam, no neuro changes, for pain control and muscle relaxer prn,  to f/u any worsening symptoms or concerns

## 2015-03-28 ENCOUNTER — Encounter: Payer: Self-pay | Admitting: Internal Medicine

## 2015-03-28 ENCOUNTER — Ambulatory Visit (INDEPENDENT_AMBULATORY_CARE_PROVIDER_SITE_OTHER): Payer: BC Managed Care – PPO | Admitting: Internal Medicine

## 2015-03-28 ENCOUNTER — Other Ambulatory Visit: Payer: BC Managed Care – PPO

## 2015-03-28 ENCOUNTER — Other Ambulatory Visit (HOSPITAL_COMMUNITY)
Admission: RE | Admit: 2015-03-28 | Discharge: 2015-03-28 | Disposition: A | Discharge status: Home / Self Care | Payer: BC Managed Care – PPO | Source: Ambulatory Visit | Attending: Internal Medicine | Admitting: Internal Medicine

## 2015-03-28 VITALS — BP 126/88 | HR 76 | Temp 98.7°F | Wt 180.0 lb

## 2015-03-28 DIAGNOSIS — L299 Pruritus, unspecified: Secondary | ICD-10-CM

## 2015-03-28 DIAGNOSIS — Z113 Encounter for screening for infections with a predominantly sexual mode of transmission: Secondary | ICD-10-CM | POA: Insufficient documentation

## 2015-03-28 DIAGNOSIS — N63 Unspecified lump in unspecified breast: Secondary | ICD-10-CM

## 2015-03-28 DIAGNOSIS — Z0000 Encounter for general adult medical examination without abnormal findings: Secondary | ICD-10-CM

## 2015-03-28 DIAGNOSIS — N898 Other specified noninflammatory disorders of vagina: Secondary | ICD-10-CM | POA: Diagnosis not present

## 2015-03-28 DIAGNOSIS — Z01419 Encounter for gynecological examination (general) (routine) without abnormal findings: Secondary | ICD-10-CM | POA: Insufficient documentation

## 2015-03-28 MED ORDER — BETAMETHASONE DIPROPIONATE AUG 0.05 % EX CREA: TOPICAL_CREAM | Freq: Two times a day (BID) | CUTANEOUS | Status: DC

## 2015-03-28 MED ORDER — METRONIDAZOLE 500 MG PO TABS: 500.0000 mg | ORAL_TABLET | Freq: Three times a day (TID) | ORAL | Status: DC

## 2015-03-28 NOTE — Assessment & Plan Note (Addendum)
3/16 R below the nipple 6 o'clock Diag mammo

## 2015-03-28 NOTE — Assessment & Plan Note (Signed)
Eczema Zyrtec Kenalog topically

## 2015-03-28 NOTE — Patient Instructions (Signed)
Use Zyrtec 10 mg a day for allergies

## 2015-03-28 NOTE — Progress Notes (Signed)
Pre visit review using our clinic review tool, if applicable. No additional management support is needed unless otherwise documented below in the visit note. 

## 2015-03-28 NOTE — Progress Notes (Signed)
Subjective:     C/o skin itching  Vaginal Discharge The patient's primary symptoms include vaginal discharge. This is a new problem. The current episode started in the past 7 days. The problem occurs intermittently. The problem has been unchanged. She is not pregnant. Pertinent negatives include no chills, dysuria, hematuria, rash or sore throat. The vaginal discharge was watery and yellow. There has been no bleeding. She has not been passing clots. Nothing aggravates the symptoms. She is postmenopausal. 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  HENT: Negative for congestion, dental problem, ear pain, hearing loss, mouth sores, postnasal drip, sinus pressure, sneezing, sore throat and voice change.   Eyes: Negative for pain and visual disturbance.  Respiratory: Negative for chest tightness, wheezing and stridor.   Cardiovascular: Negative for leg swelling.  Gastrointestinal: Negative for blood in stool, abdominal distention and rectal pain.  Genitourinary: Positive for vaginal discharge and menstrual problem. Negative for dysuria, hematuria, decreased urine volume, vaginal bleeding, difficulty urinating and vaginal pain.  Musculoskeletal: Positive for myalgias. Negative for joint swelling, gait problem and neck pain.  Skin: Negative for color change, rash and wound.  Neurological: Negative for tremors, syncope, speech difficulty and light-headedness.  Hematological: Negative for adenopathy.  Psychiatric/Behavioral: Negative for suicidal ideas, hallucinations, behavioral problems, confusion, sleep disturbance, dysphoric mood and decreased concentration. The patient is not hyperactive.    Wt Readings from Last 3 Encounters:   03/28/15 180 lb (81.647 kg)  11/30/14 176 lb (79.833 kg)  11/03/13 170 lb (77.111 kg)   BP Readings from Last 3 Encounters:  03/28/15 126/88  11/30/14 118/80  11/03/13 100/78        Objective:   Physical Exam  Constitutional: She appears well-developed and well-nourished. No distress.  HENT:  Head: Normocephalic.  Right Ear: External ear normal.  Left Ear: External ear normal.  Nose: Nose normal.  Mouth/Throat: Oropharynx is clear and moist.  Eyes: Conjunctivae are normal. Pupils are equal, round, and reactive to light. Right eye exhibits no discharge. Left eye exhibits no discharge.  Neck: Normal range of motion. Neck supple. No JVD present. No tracheal deviation present. No thyromegaly present.  Cardiovascular: Normal rate, regular rhythm and normal heart sounds.   Pulmonary/Chest: Breath sounds normal. No stridor. No respiratory distress. She has no wheezes.  B breasts wnl  Abdominal: Soft. Bowel sounds are normal. She exhibits no distension and no mass. There is no tenderness. There is no rebound and no guarding.  Genitourinary: Uterus normal. Guaiac negative stool. Vaginal discharge found.  Musculoskeletal: She exhibits no edema or tenderness.  Lymphadenopathy:    She has no cervical adenopathy.  Neurological: She displays normal reflexes. No cranial nerve deficit. She exhibits normal muscle tone. Coordination normal.  Skin: No rash noted. No erythema.  Psychiatric: She has a normal mood and affect. Her behavior is normal. Judgment and thought content normal.  Hyperpigmented rash on face, neck and chest   Lab Results  Component Value Date   WBC 5.9 12/29/2012   HGB 13.4 12/29/2012   HCT 39.4 12/29/2012   PLT 233.0 12/29/2012   CHOL 173 12/29/2012   TRIG 56.0 12/29/2012   HDL 74.00 12/29/2012   ALT 21 12/29/2012   AST 25 12/29/2012   NA 140  12/29/2012   K 4.1 12/29/2012   CL 103 12/29/2012   CREATININE 1.2 12/29/2012   BUN 16 12/29/2012   CO2 30 12/29/2012    TSH 1.61 12/29/2012         Assessment & Plan:

## 2015-03-28 NOTE — Assessment & Plan Note (Signed)
3/16 ?bact vaginosis  Flagyl po

## 2015-03-29 LAB — KOH PREP: RESULT - KOH: NONE SEEN

## 2015-03-30 LAB — CYTOLOGY - PAP

## 2015-03-31 ENCOUNTER — Telehealth: Payer: Self-pay

## 2015-03-31 NOTE — Telephone Encounter (Signed)
-----   Message from Cassandria Anger, MD sent at 03/31/2015  3:34 PM EDT ----- Erline Levine, please, inform patient that her PAP is nl Thx

## 2015-03-31 NOTE — Telephone Encounter (Signed)
Advised pt of normal lab result

## 2015-04-03 ENCOUNTER — Other Ambulatory Visit: Payer: Self-pay | Admitting: Internal Medicine

## 2015-04-03 DIAGNOSIS — N63 Unspecified lump in unspecified breast: Secondary | ICD-10-CM

## 2015-04-07 ENCOUNTER — Ambulatory Visit
Admission: RE | Admit: 2015-04-07 | Discharge: 2015-04-07 | Disposition: A | Discharge status: Home / Self Care | Payer: BC Managed Care – PPO | Source: Ambulatory Visit | Attending: Internal Medicine | Admitting: Internal Medicine

## 2015-04-07 DIAGNOSIS — N63 Unspecified lump in unspecified breast: Secondary | ICD-10-CM

## 2016-02-29 ENCOUNTER — Ambulatory Visit (INDEPENDENT_AMBULATORY_CARE_PROVIDER_SITE_OTHER): Payer: BC Managed Care – PPO | Admitting: Family Medicine

## 2016-02-29 ENCOUNTER — Telehealth: Payer: Self-pay | Admitting: Internal Medicine

## 2016-02-29 ENCOUNTER — Encounter: Payer: Self-pay | Admitting: Family Medicine

## 2016-02-29 VITALS — BP 108/80 | HR 73 | Temp 98.1°F | Ht 66.0 in | Wt 169.0 lb

## 2016-02-29 DIAGNOSIS — R300 Dysuria: Secondary | ICD-10-CM

## 2016-02-29 LAB — POCT URINALYSIS DIPSTICK
Bilirubin, UA: NEGATIVE
GLUCOSE UA: NEGATIVE
KETONES UA: NEGATIVE
Nitrite, UA: NEGATIVE
Protein, UA: NEGATIVE
Urobilinogen, UA: 0.2
pH, UA: 8

## 2016-02-29 MED ORDER — NITROFURANTOIN MONOHYD MACRO 100 MG PO CAPS: 100.0000 mg | ORAL_CAPSULE | Freq: Two times a day (BID) | ORAL | Status: DC

## 2016-02-29 NOTE — Progress Notes (Signed)
Pre visit review using our clinic review tool, if applicable. No additional management support is needed unless otherwise documented below in the visit note. 

## 2016-02-29 NOTE — Telephone Encounter (Signed)
Patient Name: Belinda Johnson  DOB: [id number]    Initial Comment Caller states has blood in urine   Nurse Assessment  Nurse: Raphael Gibney, RN, Vanita Ingles Date/Time (Eastern Time): 02/29/2016 8:47:56 AM  Confirm and document reason for call. If symptomatic, describe symptoms. You must click the next button to save text entered. ---Caller states she is having blood in her urine. She has pain when she urinates. She has back pain. No fever.  Has the patient traveled out of the country within the last 30 days? ---Not Applicable  Does the patient have any new or worsening symptoms? ---Yes  Will a triage be completed? ---Yes  Related visit to physician within the last 2 weeks? ---No  Does the PT have any chronic conditions? (i.e. diabetes, asthma, etc.) ---No  Is the patient pregnant or possibly pregnant? (Ask all females between the ages of 68-55) ---No  Is this a behavioral health or substance abuse call? ---No     Guidelines    Guideline Title Affirmed Question Affirmed Notes  Urination Pain - Female Side (flank) or lower back pain present    Final Disposition User   See Physician within 4 Hours (or PCP triage) Raphael Gibney, RN, Vera    Comments  P states she does not want to see a NP but wants to see a doctor. No appts available at [place] scheduled at [facility] on 02/29/16 with Dr. Colin Benton.   Referrals  REFERRED TO PCP OFFICE   Disagree/Comply: Comply

## 2016-02-29 NOTE — Progress Notes (Signed)
  HPI:  Belinda Johnson is a very pleasant 50 year old here for an acute visit for dysuria. She reports this started yesterday. Symptoms include discomfort with urination, frequency and hematuria twice yesterday. She denies abdominal or flank pain, fevers, nausea, vomiting, vaginal discharge or concern for sexual transmitted infections, malaise. She reports a history of UTI. She no longer has periods as she is postmenopausal.   ROS: See pertinent positives and negatives per HPI.  Past Medical History  Diagnosis Date  . LBP (low back pain)   . Allergic rhinitis   . H/O varicella   . Irregular periods/menstrual cycles 2011  . Ovarian cyst, right 2011  . DUB (dysfunctional uterine bleeding) 2011  . H/O: menorrhagia 11/18/11    No past surgical history on file.  Family History  Problem Relation Age of Onset  . Osteoarthritis Mother   . Hypertension Mother   . Hypertension Other     Social History   Social History  . Marital Status: Married    Spouse Name: N/A  . Number of Children: 4  . 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 Smoking status: Never Smoker   . Smokeless tobacco: None  . Alcohol Use: 0.6 oz/week    1 Glasses of wine per week     Comment: occassional  . Drug Use: No  . Sexual Activity: Yes   Other Topics Concern  . None   Social History Narrative   ** Merged History Encounter **         Current outpatient prescriptions:  .  cholecalciferol (VITAMIN D) 1000 UNITS tablet, Take 1,000 Units by mouth daily., Disp: , Rfl:  .  nitrofurantoin, macrocrystal-monohydrate, (MACROBID) 100 MG capsule, Take 1 capsule (100 mg total) by mouth 2 (two) times daily., Disp: 14 capsule, Rfl: 0  EXAM:  Filed Vitals:   02/29/16 1115  BP: 108/80  Pulse: 73  Temp: 98.1 F (36.7 C)    Body mass index is 27.29 kg/(m^2).  GENERAL: vitals reviewed and listed above, alert, oriented, appears well hydrated and  in no acute distress  HEENT: atraumatic, conjunttiva clear, no obvious abnormalities on inspection of external nose and ears  NECK: no obvious masses on inspection  LUNGS: clear to auscultation bilaterally, no wheezes, rales or rhonchi, good air movement  CV: HRRR, no peripheral edema  ABD: soft, nttp, no CVA TTP  MS: moves all extremities without noticeable abnormality  PSYCH: pleasant and cooperative, no obvious depression or anxiety  ASSESSMENT AND PLAN:  Discussed the following assessment and plan:  Dysuria - Plan: POC Urinalysis Dipstick, Culture, Urine  -Urine dip suggests UTI, she opted for empiric treatment with nitrofurantoin -Culture pending -Patient advised to return or notify a doctor immediately if symptoms worsen or persist or new concerns arise.  There are no Patient Instructions on file for this visit.   Colin Benton R.

## 2016-03-01 ENCOUNTER — Encounter: Payer: Self-pay | Admitting: Family Medicine

## 2016-03-01 ENCOUNTER — Ambulatory Visit (INDEPENDENT_AMBULATORY_CARE_PROVIDER_SITE_OTHER): Payer: BC Managed Care – PPO | Admitting: Family Medicine

## 2016-03-01 VITALS — BP 102/80 | HR 81 | Temp 98.2°F | Ht 66.0 in

## 2016-03-01 DIAGNOSIS — M533 Sacrococcygeal disorders, not elsewhere classified: Secondary | ICD-10-CM | POA: Diagnosis not present

## 2016-03-01 DIAGNOSIS — M25511 Pain in right shoulder: Secondary | ICD-10-CM | POA: Diagnosis not present

## 2016-03-01 DIAGNOSIS — N390 Urinary tract infection, site not specified: Secondary | ICD-10-CM | POA: Diagnosis not present

## 2016-03-01 NOTE — Telephone Encounter (Signed)
Please let pt know should follow up with her doctor if having back issues or is worsening. The abx can take some time to work, but if feeling worse should be seen.

## 2016-03-01 NOTE — Telephone Encounter (Signed)
error 

## 2016-03-01 NOTE — Progress Notes (Signed)
Pre visit review using our clinic review tool, if applicable. No additional management support is needed unless otherwise documented below in the visit note. 

## 2016-03-01 NOTE — Progress Notes (Signed)
HPI:  Belinda Johnson is a 50 year old here for an acute visit for tailbone pain. She was seen yesterday for UTI. She reports that her urinary symptoms and hematuria have completely resolved on and antibiotic. However, she has had intermittent sharp pain in her tailbone for 2 days. She cannot remember injuring this area. The pain is worse when she sits on hard surfaces and with prolonged sitting, and is better when she is up and around. He has not taken any medications for this as she was afraid of interactions with her antibiotic. She denies redness, swelling, radiation of pain, fevers, malaise, weakness or numbness in her extremities, bowel or bladder dysfunction. She also reports she has some right shoulder pain. She reports this started with lifting heavy boxes during a move 2 months ago. She reports it has improved significantly but she still notices activities that involve abduction of the right shoulder above 90. She denies radiation of this pain, weakness, numbness or worsening.   ROS: pertinent positives and negatives per HPI.  Past Medical History  Diagnosis Date  . LBP (low back pain)   . Allergic rhinitis   . H/O varicella   . Irregular periods/menstrual cycles 2011  . Ovarian cyst, right 2011  . DUB (dysfunctional uterine bleeding) 2011  . H/O: menorrhagia 11/18/11    No past surgical history on file.  Family History  Problem Relation Age of Onset  . Osteoarthritis Mother   . Hypertension Mother   . Hypertension Other     Social History   Social History  . Marital Status: Married    Spouse Name: N/A  . Number of Children: 4  . 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 Smoking status: Never Smoker   . Smokeless tobacco: None  . Alcohol Use: 0.6 oz/week    1 Glasses of wine per week     Comment: occassional  . Drug Use: No  . Sexual Activity: Yes   Other Topics Concern  . None   Social History Narrative    ** Merged History Encounter **         Current outpatient prescriptions:  .  cholecalciferol (VITAMIN D) 1000 UNITS tablet, Take 1,000 Units by mouth daily., Disp: , Rfl:  .  nitrofurantoin, macrocrystal-monohydrate, (MACROBID) 100 MG capsule, Take 1 capsule (100 mg total) by mouth 2 (two) times daily., Disp: 14 capsule, Rfl: 0  EXAM:  Filed Vitals:   03/01/16 1620  BP: 102/80  Pulse: 81  Temp: 98.2 F (36.8 C)    There is no weight on file to calculate BMI.  GENERAL: vitals reviewed and listed above, alert, oriented, appears well hydrated and in no acute distress  HEENT: atraumatic, conjunttiva clear, no obvious abnormalities on inspection of external nose and ears  NECK: no obvious masses on inspection  MS: moves all extremities without noticeable abnormality, Normal gait, normal inspection of the low back but IN the gluteal cleft without any redness or swelling. She does have 2 small nevi on the upper gluteal cleft. Minimal tenderness to palpation over the tailbone approximately 1 inch from the end of the tailbone. No other significant tenderness to palpation. Normal strength and motion of the lower extremities bilaterally. No specific motions other then when she goes to stand up from sitting seems to worsen the pain. On inspection of the shoulder, no obvious deformities. Mild tenderness to palpation over the supraspinatus muscle and attachment to the  humerus. Mild symptoms with impingement test. Normal range of motion of the shoulder, normal strength in the upper extremity negative apprehension test, negative shawl sign. Neurovascularly intact distally.  PSYCH: pleasant and cooperative, no obvious depression or anxiety  ASSESSMENT AND PLAN:  Discussed the following assessment and plan:  Coccydynia Discussed potential etiologies. No findings to suggest a skin infection, pilonidal cyst, or abscess. She seems quite concerned about this so we opted to get plain films to  evaluate further. Advised as needed Aleve, avoidance of sitting on hard surfaces, heat and follow-up with her doctor in 3-4 weeks or sooner if symptoms worsen or new symptoms develop. This does not seem to be related to her UTI.  Right shoulder pain  Suspect rotator cuff injury. Opted to do a trial of home exercises and as needed analgesic given it seems to be improving. Advised follow-up with her regular doctor in 3-4 weeks.  UTI (lower urinary tract infection) She has improved significantly since yesterday with resolution of her dysuria and hematuria. Advised to The antibiotic. Culture is pending.  -Patient advised to return or notify a doctor immediately if symptoms worsen or persist or new concerns arise.  Patient Instructions  Before you leave: -X-ray sheet -Rotator cuff exercises  Please call your regular doctor's office and schedule a follow-up in 3-4 weeks.  Please go get the x-ray of your tailbone. Please avoid sitting on hard surfaces and use Aleve per instructions as needed for pain.  Please take the antibiotic for the urinary tract infection and follow-up with your regular doctor if you have persistent symptoms.  Please do the shoulder exercises 3-4 days per week.     Colin Benton R.

## 2016-03-01 NOTE — Patient Instructions (Signed)
Before you leave: -X-ray sheet -Rotator cuff exercises  Please call your regular doctor's office and schedule a follow-up in 3-4 weeks.  Please go get the x-ray of your tailbone. Please avoid sitting on hard surfaces and use Aleve per instructions as needed for pain.  Please take the antibiotic for the urinary tract infection and follow-up with your regular doctor if you have persistent symptoms.  Please do the shoulder exercises 3-4 days per week.

## 2016-03-01 NOTE — Telephone Encounter (Signed)
Pt states she is getting worse with pain in the spine.  The UTI is getting better, but the pain is getting worse.  Pt advised to call back if not better or worse. Pt would like a call back as soon as possible.  [facility]/ [address]

## 2016-03-01 NOTE — Progress Notes (Signed)
Patient's weight not measured today due to difficulty walking.

## 2016-03-03 LAB — URINE CULTURE: Colony Count: >=100000

## 2016-06-12 ENCOUNTER — Encounter: Payer: Self-pay | Admitting: Internal Medicine

## 2016-06-12 ENCOUNTER — Ambulatory Visit (INDEPENDENT_AMBULATORY_CARE_PROVIDER_SITE_OTHER): Payer: BC Managed Care – PPO | Admitting: Internal Medicine

## 2016-06-12 VITALS — BP 108/78 | HR 78 | Temp 98.6°F | Resp 16 | Ht 66.0 in | Wt 172.4 lb

## 2016-06-12 DIAGNOSIS — M545 Low back pain, unspecified: Secondary | ICD-10-CM

## 2016-06-12 DIAGNOSIS — G8929 Other chronic pain: Secondary | ICD-10-CM | POA: Diagnosis not present

## 2016-06-12 DIAGNOSIS — Z780 Asymptomatic menopausal state: Secondary | ICD-10-CM | POA: Diagnosis not present

## 2016-06-12 DIAGNOSIS — Z0000 Encounter for general adult medical examination without abnormal findings: Secondary | ICD-10-CM | POA: Diagnosis not present

## 2016-06-12 MED ORDER — NABUMETONE 500 MG PO TABS: 500.0000 mg | ORAL_TABLET | Freq: Two times a day (BID) | ORAL | Status: DC | PRN

## 2016-06-12 NOTE — Assessment & Plan Note (Signed)
LMP at 50 yo

## 2016-06-12 NOTE — Progress Notes (Signed)
Pre visit review using our clinic review tool, if applicable. No additional management support is needed unless otherwise documented below in the visit note. 

## 2016-06-12 NOTE — Progress Notes (Signed)
Subjective:  Patient ID: Belinda Johnson, female    DOB: 1966-12-07  Age: 50 y.o. MRN: [id number]  CC: No chief complaint on file.   HPI Belinda Johnson presents for a well exam LMP at 50 C/o LBP - recurrent MSK by the end of the day Pt had a recent UTI - took abx  Outpatient Prescriptions Prior to Visit  Medication Sig Dispense Refill  . cholecalciferol (VITAMIN D) 1000 UNITS tablet Take 1,000 Units by mouth daily.    . nitrofurantoin, macrocrystal-monohydrate, (MACROBID) 100 MG capsule Take 1 capsule (100 mg total) by mouth 2 (two) times daily. (Patient not taking: Reported on 06/12/2016) 14 capsule 0   No facility-administered medications prior to visit.    ROS Review of Systems  Constitutional: Negative for chills, activity change, appetite change, fatigue and unexpected weight change.  HENT: Negative for congestion, mouth sores and sinus pressure.   Eyes: Negative for visual disturbance.  Respiratory: Negative for cough and chest tightness.   Gastrointestinal: Negative for nausea and abdominal pain.  Genitourinary: Negative for frequency, difficulty urinating and vaginal pain.  Musculoskeletal: Positive for back pain. Negative for gait problem.  Skin: Negative for pallor and rash.  Neurological: Negative for dizziness, tremors, weakness, numbness and headaches.  Psychiatric/Behavioral: Negative for confusion and sleep disturbance.    Objective:  BP 108/78 mmHg  Pulse 78  Temp(Src) 98.6 F (37 C) (Oral)  Resp 16  Ht 5\' 6"  (1.676 m)  Wt 172 lb 6.4 oz (78.2 kg)  BMI 27.84 kg/m2  SpO2 98%  BP Readings from Last 3 Encounters:  06/12/16 108/78  03/01/16 102/80  02/29/16 108/80    Wt Readings from Last 3 Encounters:  06/12/16 172 lb 6.4 oz (78.2 kg)  02/29/16 169 lb (76.658 kg)  03/28/15 180 lb (81.647 kg)    Physical Exam  Constitutional: She appears well-developed. No distress.  HENT:  Head: Normocephalic.  Right Ear: External ear normal.  Left Ear:  External ear normal.  Nose: Nose normal.  Mouth/Throat: Oropharynx is clear and moist. No oropharyngeal exudate.  Eyes: Conjunctivae are normal. Pupils are equal, round, and reactive to light. Right eye exhibits no discharge. Left eye exhibits no discharge.  Neck: Normal range of motion. Neck supple. No JVD present. No tracheal deviation present. No thyromegaly present.  Cardiovascular: Normal rate, regular rhythm and normal heart sounds.   Pulmonary/Chest: No stridor. No respiratory distress. She has no wheezes.  Abdominal: Soft. Bowel sounds are normal. She exhibits no distension and no mass. There is no tenderness. There is no rebound and no guarding.  Musculoskeletal: She exhibits no edema or tenderness.  Lymphadenopathy:    She has no cervical adenopathy.  Neurological: She displays normal reflexes. No cranial nerve deficit. She exhibits normal muscle tone. Coordination normal.  Skin: No rash noted. No erythema.  Psychiatric: She has a normal mood and affect. Her behavior is normal. Judgment and thought content normal.  LS tender w/ROM - sacral area  Lab Results  Component Value Date   WBC 5.9 12/29/2012   HGB 13.4 12/29/2012   HCT 39.4 12/29/2012   PLT 233.0 12/29/2012   GLUCOSE 87 12/29/2012   CHOL 173 12/29/2012   TRIG 56.0 12/29/2012   HDL 74.00 12/29/2012   LDLCALC 88 12/29/2012   ALT 21 12/29/2012   AST 25 12/29/2012   NA 140 12/29/2012   K 4.1 12/29/2012   CL 103 12/29/2012   CREATININE 1.2 12/29/2012   BUN 16 12/29/2012   CO2  30 12/29/2012   TSH 1.61 12/29/2012    US Breast Ltd Uni Right Inc Axilla  04/07/2015  CLINICAL DATA:  Patient presents for evaluation of palpable abnormality within the 6 o'clock subareolar right breast. EXAM: DIGITAL DIAGNOSTIC BILATERAL MAMMOGRAM WITH 3D TOMOSYNTHESIS WITH CAD ULTRASOUND RIGHT BREAST COMPARISON:  [facility] ACR Breast Density Category c: The breast tissue is heterogeneously dense, which may obscure small masses. FINDINGS: No  concerning masses, calcifications or architectural distortion identified within either breast. No suspicious abnormality within the right breast 6 o'clock position at the site of patient reported palpable abnormality. Mammographic images were processed with CAD. On physical exam, I palpate no discrete mass within the 6 o'clock position right breast subareolar location. Targeted ultrasound is performed, showing no concerning mass within the right breast 6 o'clock position subareolar location. Normal tissue is visualized. IMPRESSION: No mammographic evidence for malignancy. RECOMMENDATION: Screening mammogram in one year.(Code:SM-B-01Y). Continued clinical evaluation for reported right breast palpable abnormality. I have discussed the findings and recommendations with the patient. Results were also provided in writing at the conclusion of the visit. If applicable, a reminder letter will be sent to the patient regarding the next appointment. BI-RADS CATEGORY  1: Negative. Electronically Signed   By: Lovey Newcomer M.D.   On: 04/07/2015 11:24   Mm Diag Breast Tomo Bilateral  04/07/2015  CLINICAL DATA:  Patient presents for evaluation of palpable abnormality within the 6 o'clock subareolar right breast. EXAM: DIGITAL DIAGNOSTIC BILATERAL MAMMOGRAM WITH 3D TOMOSYNTHESIS WITH CAD ULTRASOUND RIGHT BREAST COMPARISON:  [facility] ACR Breast Density Category c: The breast tissue is heterogeneously dense, which may obscure small masses. FINDINGS: No concerning masses, calcifications or architectural distortion identified within either breast. No suspicious abnormality within the right breast 6 o'clock position at the site of patient reported palpable abnormality. Mammographic images were processed with CAD. On physical exam, I palpate no discrete mass within the 6 o'clock position right breast subareolar location. Targeted ultrasound is performed, showing no concerning mass within the right breast 6 o'clock position subareolar  location. Normal tissue is visualized. IMPRESSION: No mammographic evidence for malignancy. RECOMMENDATION: Screening mammogram in one year.(Code:SM-B-01Y). Continued clinical evaluation for reported right breast palpable abnormality. I have discussed the findings and recommendations with the patient. Results were also provided in writing at the conclusion of the visit. If applicable, a reminder letter will be sent to the patient regarding the next appointment. BI-RADS CATEGORY  1: Negative. Electronically Signed   By: Lovey Newcomer M.D.   On: 04/07/2015 11:24    Assessment & Plan:   There are no diagnoses linked to this encounter. I am having Belinda Johnson maintain her cholecalciferol and nitrofurantoin (macrocrystal-monohydrate).  No orders of the defined types were placed in this encounter.     Follow-up: No Follow-up on file.  Walker Kehr, MD

## 2016-06-12 NOTE — Assessment & Plan Note (Signed)
Stretch Relafen 500 mg bid Vit D

## 2016-06-12 NOTE — Assessment & Plan Note (Addendum)
We discussed age appropriate health related issues, including available/recomended screening tests and vaccinations. We discussed a need for adhering to healthy diet and exercise. Labs/EKG were reviewed/ordered. All questions were answered. Labs Mammo PAP next year

## 2016-06-13 NOTE — Addendum Note (Signed)
Addended by: Cassandria Anger on: 06/13/2016 07:52 AM   Modules accepted: Orders

## 2016-06-14 ENCOUNTER — Other Ambulatory Visit (INDEPENDENT_AMBULATORY_CARE_PROVIDER_SITE_OTHER): Payer: BC Managed Care – PPO

## 2016-06-14 ENCOUNTER — Other Ambulatory Visit: Payer: Self-pay | Admitting: Internal Medicine

## 2016-06-14 DIAGNOSIS — Z0000 Encounter for general adult medical examination without abnormal findings: Secondary | ICD-10-CM | POA: Diagnosis not present

## 2016-06-14 DIAGNOSIS — Z1231 Encounter for screening mammogram for malignant neoplasm of breast: Secondary | ICD-10-CM

## 2016-06-14 LAB — URINALYSIS
Bilirubin Urine: NEGATIVE
Hgb urine dipstick: NEGATIVE
Ketones, ur: NEGATIVE
Leukocytes, UA: NEGATIVE
Nitrite: NEGATIVE
PH: 6 (ref 5.0–8.0)
SPECIFIC GRAVITY, URINE: 1.02 (ref 1.000–1.030)
Total Protein, Urine: NEGATIVE
URINE GLUCOSE: NEGATIVE
Urobilinogen, UA: 0.2 (ref 0.0–1.0)

## 2016-06-14 LAB — BASIC METABOLIC PANEL
BUN: 15 mg/dL (ref 6–23)
CALCIUM: 9.6 mg/dL (ref 8.4–10.5)
CO2: 31 mEq/L (ref 19–32)
Chloride: 106 mEq/L (ref 96–112)
Creatinine, Ser: 1.28 mg/dL — ABNORMAL HIGH (ref 0.40–1.20)
GFR: 57.05 mL/min — AB (ref >60.00)
GLUCOSE: 86 mg/dL (ref 70–99)
POTASSIUM: 4.7 meq/L (ref 3.5–5.1)
Sodium: 141 mEq/L (ref 135–145)

## 2016-06-14 LAB — LIPID PANEL
CHOL/HDL RATIO: 2
Cholesterol: 150 mg/dL (ref 0–200)
HDL: 68.4 mg/dL (ref >39.00)
LDL CALC: 73 mg/dL (ref 0–99)
NonHDL: 81.37
Triglycerides: 43 mg/dL (ref 0.0–149.0)
VLDL: 8.6 mg/dL (ref 0.0–40.0)

## 2016-06-14 LAB — CBC WITH DIFFERENTIAL/PLATELET
BASOS ABS: 0 10*3/uL (ref 0.0–0.1)
Basophils Relative: 0.8 % (ref 0.0–3.0)
EOS ABS: 0.1 10*3/uL (ref 0.0–0.7)
Eosinophils Relative: 1.6 % (ref 0.0–5.0)
HCT: 38.6 % (ref 36.0–46.0)
HEMOGLOBIN: 13.1 g/dL (ref 12.0–15.0)
LYMPHS ABS: 2.5 10*3/uL (ref 0.7–4.0)
Lymphocytes Relative: 55.4 % — ABNORMAL HIGH (ref 12.0–46.0)
MCHC: 33.9 g/dL (ref 30.0–36.0)
MCV: 85.2 fl (ref 78.0–100.0)
MONO ABS: 0.3 10*3/uL (ref 0.1–1.0)
Monocytes Relative: 5.6 % (ref 3.0–12.0)
NEUTROS PCT: 36.6 % — AB (ref 43.0–77.0)
Neutro Abs: 1.7 10*3/uL (ref 1.4–7.7)
Platelets: 242 10*3/uL (ref 150.0–400.0)
RBC: 4.53 Mil/uL (ref 3.87–5.11)
RDW: 13.2 % (ref 11.5–15.5)
WBC: 4.6 10*3/uL (ref 4.0–10.5)

## 2016-06-14 LAB — HEPATIC FUNCTION PANEL
ALK PHOS: 77 U/L (ref 39–117)
ALT: 17 U/L (ref 0–35)
AST: 20 U/L (ref 0–37)
Albumin: 4.2 g/dL (ref 3.5–5.2)
BILIRUBIN DIRECT: 0.1 mg/dL (ref 0.0–0.3)
BILIRUBIN TOTAL: 0.3 mg/dL (ref 0.2–1.2)
Total Protein: 6.9 g/dL (ref 6.0–8.3)

## 2016-06-14 LAB — VITAMIN D 25 HYDROXY (VIT D DEFICIENCY, FRACTURES): VITD: 33.18 ng/mL (ref 30.00–100.00)

## 2016-06-14 LAB — TSH: TSH: 1.99 u[IU]/mL (ref 0.35–4.50)

## 2016-06-14 LAB — VITAMIN B12: VITAMIN B 12: 421 pg/mL (ref 211–911)

## 2016-06-21 ENCOUNTER — Ambulatory Visit
Admission: RE | Admit: 2016-06-21 | Discharge: 2016-06-21 | Disposition: A | Discharge status: Home / Self Care | Payer: BC Managed Care – PPO | Source: Ambulatory Visit | Attending: Internal Medicine | Admitting: Internal Medicine

## 2016-06-21 DIAGNOSIS — Z1231 Encounter for screening mammogram for malignant neoplasm of breast: Secondary | ICD-10-CM

## 2017-01-07 ENCOUNTER — Ambulatory Visit: Payer: Self-pay | Admitting: Internal Medicine

## 2017-08-11 ENCOUNTER — Encounter: Payer: Self-pay | Admitting: Family Medicine

## 2017-08-11 ENCOUNTER — Telehealth: Payer: Self-pay | Admitting: Internal Medicine

## 2017-08-11 ENCOUNTER — Ambulatory Visit (INDEPENDENT_AMBULATORY_CARE_PROVIDER_SITE_OTHER): Payer: BC Managed Care – PPO | Admitting: Family Medicine

## 2017-08-11 VITALS — BP 110/78 | HR 80 | Temp 98.5°F | Ht 66.0 in | Wt 175.0 lb

## 2017-08-11 DIAGNOSIS — M545 Low back pain, unspecified: Secondary | ICD-10-CM

## 2017-08-11 DIAGNOSIS — S060X0A Concussion without loss of consciousness, initial encounter: Secondary | ICD-10-CM | POA: Insufficient documentation

## 2017-08-11 DIAGNOSIS — M542 Cervicalgia: Secondary | ICD-10-CM

## 2017-08-11 MED ORDER — CYCLOBENZAPRINE HCL 5 MG PO TABS: 5.0000 mg | ORAL_TABLET | Freq: Three times a day (TID) | ORAL | 1 refills | Status: DC | PRN

## 2017-08-11 MED ORDER — MELOXICAM 15 MG PO TABS: 15.0000 mg | ORAL_TABLET | Freq: Every day | ORAL | 0 refills | Status: DC | PRN
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
## 2017-08-11 NOTE — Telephone Encounter (Signed)
Patient was triaged by [facility].  [facility] sent patient back through the doc line.  We have scheduled for today at 245pm with Raeford Razor.

## 2017-08-11 NOTE — Assessment & Plan Note (Signed)
Acute on chronic in nature. Reviewed her previous lumbar x-ray. Most likely musculoskeletal as in nature. - Prescribed mobic and Flexeril. - If no improvement consider physical therapy.
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
## 2017-08-11 NOTE — Telephone Encounter (Signed)
noted 
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
## 2017-08-11 NOTE — Assessment & Plan Note (Signed)
Acute in nature. Appears to be muscular in nature. No suggestion of fracture at this time. - Mobic and Flexeril. - If no improvement consider physical therapy.

## 2018-04-06 ENCOUNTER — Encounter: Payer: Self-pay | Admitting: Internal Medicine

## 2018-04-06 ENCOUNTER — Other Ambulatory Visit (INDEPENDENT_AMBULATORY_CARE_PROVIDER_SITE_OTHER): Payer: BC Managed Care – PPO

## 2018-04-06 ENCOUNTER — Ambulatory Visit: Payer: BC Managed Care – PPO | Admitting: Internal Medicine

## 2018-04-06 VITALS — BP 120/88 | HR 74 | Temp 98.1°F | Resp 16 | Ht 66.0 in | Wt 184.2 lb

## 2018-04-06 DIAGNOSIS — J029 Acute pharyngitis, unspecified: Secondary | ICD-10-CM | POA: Diagnosis not present

## 2018-04-06 LAB — CBC WITH DIFFERENTIAL/PLATELET
Basophils Absolute: 0 10*3/uL (ref 0.0–0.1)
Basophils Relative: 0.3 % (ref 0.0–3.0)
EOS PCT: 1.1 % (ref 0.0–5.0)
Eosinophils Absolute: 0.1 10*3/uL (ref 0.0–0.7)
HEMATOCRIT: 39.5 % (ref 36.0–46.0)
HEMOGLOBIN: 13.2 g/dL (ref 12.0–15.0)
Lymphocytes Relative: 40.7 % (ref 12.0–46.0)
Lymphs Abs: 2.8 10*3/uL (ref 0.7–4.0)
MCHC: 33.3 g/dL (ref 30.0–36.0)
MCV: 86.4 fl (ref 78.0–100.0)
MONOS PCT: 6.9 % (ref 3.0–12.0)
Monocytes Absolute: 0.5 10*3/uL (ref 0.1–1.0)
Neutro Abs: 3.5 10*3/uL (ref 1.4–7.7)
Neutrophils Relative %: 51 % (ref 43.0–77.0)
Platelets: 289 10*3/uL (ref 150.0–400.0)
RBC: 4.58 Mil/uL (ref 3.87–5.11)
RDW: 13.5 % (ref 11.5–15.5)
WBC: 6.8 10*3/uL (ref 4.0–10.5)

## 2018-04-06 LAB — SEDIMENTATION RATE: Sed Rate: 2 mm/hr (ref 0–30)

## 2018-04-06 LAB — POCT RAPID STREP A (OFFICE): Rapid Strep A Screen: NEGATIVE

## 2018-04-06 MED ORDER — BENZOCAINE-MENTHOL 15-20 MG MT LOZG: 1.0000 | LOZENGE | Freq: Three times a day (TID) | OROMUCOSAL | 1 refills | Status: DC | PRN

## 2018-04-06 NOTE — Patient Instructions (Signed)

## 2018-04-06 NOTE — Progress Notes (Signed)
Subjective:  Patient ID: Belinda Johnson, female    DOB: 1966/04/22  Age: 52 y.o. MRN: [id number]  CC: Sore Throat   HPI Belinda Johnson presents for a one day history of sore throat and discomfort throughout her neck.  She denies odynophagia, fever, chills, lymphadenopathy, or rash.  Outpatient Medications Prior to Visit  Medication Sig Dispense Refill  . cholecalciferol (VITAMIN D) 1000 UNITS tablet Take 1,000 Units by mouth daily.    . cyclobenzaprine (FLEXERIL) 5 MG tablet Take 1 tablet (5 mg total) by mouth 3 (three) times daily as needed for muscle spasms. 30 tablet 1  . meloxicam (MOBIC) 15 MG tablet Take 1 tablet (15 mg total) by mouth daily as needed for pain. 30 tablet 0  . nabumetone (RELAFEN) 500 MG tablet Take 1 tablet (500 mg total) by mouth 2 (two) times daily as needed for moderate pain. 60 tablet 2  . nitrofurantoin, macrocrystal-monohydrate, (MACROBID) 100 MG capsule Take 1 capsule (100 mg total) by mouth 2 (two) times daily. 14 capsule 0   No facility-administered medications prior to visit.     ROS Review of Systems  Constitutional: Negative for chills, fatigue and fever.  HENT: Positive for sore throat. Negative for facial swelling, sinus pressure, trouble swallowing and voice change.   Eyes: Negative for visual disturbance.  Respiratory: Negative for cough, chest tightness, shortness of breath and wheezing.   Cardiovascular: Negative for chest pain and palpitations.  Gastrointestinal: Negative for abdominal pain, constipation, diarrhea, nausea and vomiting.  Endocrine: Negative.   Genitourinary: Negative.  Negative for difficulty urinating.  Musculoskeletal: Negative.  Negative for back pain and myalgias.  Skin: Negative.  Negative for color change, pallor and rash.  Allergic/Immunologic: Negative.   Neurological: Negative.  Negative for dizziness, weakness and headaches.  Hematological: Negative for adenopathy. Does not bruise/bleed easily.    Psychiatric/Behavioral: Negative.     Objective:  BP 120/88 (BP Location: Left Arm, Patient Position: Sitting, Cuff Size: Normal)   Pulse 74   Temp 98.1 F (36.7 C) (Oral)   Resp 16   Ht _0  (1.676 m)   Wt 184 lb 4 oz (83.6 kg)   SpO2 98%   BMI 29.74 kg/m   BP Readings from Last 3 Encounters:  04/06/18 120/88  08/11/17 110/78  06/12/16 108/78    Wt Readings from Last 3 Encounters:  04/06/18 184 lb 4 oz (83.6 kg)  08/11/17 175 lb (79.4 kg)  06/12/16 172 lb 6.4 oz (78.2 kg)    Physical Exam  Constitutional: She is oriented to person, place, and time.  Non-toxic appearance. She does not appear ill. No distress.  HENT:  Right Ear: Tympanic membrane normal.  Left Ear: Tympanic membrane normal.  Mouth/Throat: Mucous membranes are normal. Mucous membranes are not pale, not dry and not cyanotic. No oral lesions. Posterior oropharyngeal erythema present. No oropharyngeal exudate, posterior oropharyngeal edema or tonsillar abscesses.  Neck: Normal range of motion. Neck supple. No thyromegaly present.  Cardiovascular: Normal rate, regular rhythm, normal heart sounds and intact distal pulses. Exam reveals no gallop and no friction rub.  No murmur heard. Pulmonary/Chest: Effort normal and breath sounds normal. No stridor. No respiratory distress. She has no wheezes. She has no rhonchi. She has no rales. She exhibits no tenderness.  Abdominal: Soft. She exhibits no distension and no mass. There is no tenderness. There is no guarding.  Lymphadenopathy:    She has no cervical adenopathy.  Neurological: She is alert and oriented to person,  place, and time.  Skin: Skin is warm and dry. Rash noted. No erythema. No pallor.  Vitals reviewed.   Lab Results  Component Value Date   WBC 6.8 04/06/2018   HGB 13.2 04/06/2018   HCT 39.5 04/06/2018   PLT 289.0 04/06/2018   GLUCOSE 86 06/14/2016   CHOL 150 06/14/2016   TRIG 43.0 06/14/2016   HDL 68.40 06/14/2016   LDLCALC 73 06/14/2016    ALT 17 06/14/2016   AST 20 06/14/2016   NA 141 06/14/2016   K 4.7 06/14/2016   CL 106 06/14/2016   CREATININE 1.28 (H) 06/14/2016   BUN 15 06/14/2016   CO2 31 06/14/2016   TSH 1.99 06/14/2016    Mm Screening Breast Tomo Bilateral  Result Date: 06/21/2016 CLINICAL DATA:  Screening. EXAM: 2D DIGITAL SCREENING BILATERAL MAMMOGRAM WITH CAD AND ADJUNCT TOMO COMPARISON:  Previous exam(s). ACR Breast Density Category b: There are scattered areas of fibroglandular density. FINDINGS: There are no findings suspicious for malignancy. Images were processed with CAD. IMPRESSION: No mammographic evidence of malignancy. A result letter of this screening mammogram will be mailed directly to the patient. RECOMMENDATION: Screening mammogram in one year. (Code:SM-B-01Y) BI-RADS CATEGORY  1: Negative. Electronically Signed   By: Altamese Cabal M.D.   On: 06/24/2016 17:02    Assessment & Plan:   Belinda Johnson was seen today for sore throat.  Diagnoses and all orders for this visit:  Sore throat-her strep screen is negative. -     POCT rapid strep A  Acute pharyngitis, unspecified etiology- She has a sore throat without fever, exudate, or lymphadenopathy.  Her strep screen is negative.  Her white cell count and ESR are normal.  She has discomfort in her neck but I do not see any evidence of Luqwig's angina or lymphadenopathy.  I think this is a viral syndrome and will treat with Cepacol as needed. -     CBC with Differential/Platelet; Future -     Sedimentation rate; Future -     Benzocaine-Menthol (CEPACOL INSTAMAX) 15-20 MG LOZG; Use as directed 1 Act in the mouth or throat 3 (three) times daily between meals as needed.   I have discontinued Ziah N. Sharpnack's nitrofurantoin (macrocrystal-monohydrate), nabumetone, meloxicam, and cyclobenzaprine. I am also having her start on Benzocaine-Menthol. Additionally, I am having her maintain her cholecalciferol.  Meds ordered this encounter  Medications  .  Benzocaine-Menthol (CEPACOL INSTAMAX) 15-20 MG LOZG    Sig: Use as directed 1 Act in the mouth or throat 3 (three) times daily between meals as needed.    Dispense:  30 lozenge    Refill:  1     Follow-up: Return if symptoms worsen or fail to improve.  Scarlette Calico, MD

## 2018-05-05 ENCOUNTER — Other Ambulatory Visit (HOSPITAL_COMMUNITY)
Admission: RE | Admit: 2018-05-05 | Discharge: 2018-05-05 | Disposition: A | Discharge status: Home / Self Care | Payer: BC Managed Care – PPO | Source: Ambulatory Visit | Attending: Internal Medicine | Admitting: Internal Medicine

## 2018-05-05 ENCOUNTER — Encounter: Payer: Self-pay | Admitting: Internal Medicine

## 2018-05-05 ENCOUNTER — Ambulatory Visit (INDEPENDENT_AMBULATORY_CARE_PROVIDER_SITE_OTHER): Payer: BC Managed Care – PPO | Admitting: Internal Medicine

## 2018-05-05 ENCOUNTER — Other Ambulatory Visit (INDEPENDENT_AMBULATORY_CARE_PROVIDER_SITE_OTHER): Payer: Self-pay

## 2018-05-05 ENCOUNTER — Other Ambulatory Visit: Payer: BC Managed Care – PPO

## 2018-05-05 DIAGNOSIS — Z124 Encounter for screening for malignant neoplasm of cervix: Secondary | ICD-10-CM | POA: Insufficient documentation

## 2018-05-05 DIAGNOSIS — Z1211 Encounter for screening for malignant neoplasm of colon: Secondary | ICD-10-CM

## 2018-05-05 DIAGNOSIS — Z0000 Encounter for general adult medical examination without abnormal findings: Secondary | ICD-10-CM

## 2018-05-05 DIAGNOSIS — M545 Low back pain, unspecified: Secondary | ICD-10-CM

## 2018-05-05 DIAGNOSIS — Z1239 Encounter for other screening for malignant neoplasm of breast: Secondary | ICD-10-CM

## 2018-05-05 LAB — BASIC METABOLIC PANEL
BUN: 19 mg/dL (ref 6–23)
CHLORIDE: 104 meq/L (ref 96–112)
CO2: 31 meq/L (ref 19–32)
Calcium: 9.6 mg/dL (ref 8.4–10.5)
Creatinine, Ser: 1.35 mg/dL — ABNORMAL HIGH (ref 0.40–1.20)
GFR: 53.24 mL/min — ABNORMAL LOW (ref >60.00)
Glucose, Bld: 82 mg/dL (ref 70–99)
POTASSIUM: 4 meq/L (ref 3.5–5.1)
Sodium: 141 mEq/L (ref 135–145)

## 2018-05-05 LAB — URINALYSIS
Bilirubin Urine: NEGATIVE
Hgb urine dipstick: NEGATIVE
KETONES UR: NEGATIVE
Leukocytes, UA: NEGATIVE
Nitrite: NEGATIVE
PH: 6.5 (ref 5.0–8.0)
SPECIFIC GRAVITY, URINE: 1.02 (ref 1.000–1.030)
TOTAL PROTEIN, URINE-UPE24: NEGATIVE
Urine Glucose: NEGATIVE
Urobilinogen, UA: 0.2 (ref 0.0–1.0)

## 2018-05-05 LAB — LIPID PANEL
CHOL/HDL RATIO: 2
Cholesterol: 155 mg/dL (ref 0–200)
HDL: 64.4 mg/dL (ref >39.00)
LDL Cholesterol: 78 mg/dL (ref 0–99)
NONHDL: 90.17
TRIGLYCERIDES: 63 mg/dL (ref 0.0–149.0)
VLDL: 12.6 mg/dL (ref 0.0–40.0)

## 2018-05-05 LAB — HEPATIC FUNCTION PANEL
ALBUMIN: 4.1 g/dL (ref 3.5–5.2)
ALT: 14 U/L (ref 0–35)
AST: 19 U/L (ref 0–37)
Alkaline Phosphatase: 63 U/L (ref 39–117)
BILIRUBIN TOTAL: 0.4 mg/dL (ref 0.2–1.2)
Bilirubin, Direct: 0.1 mg/dL (ref 0.0–0.3)
Total Protein: 6.8 g/dL (ref 6.0–8.3)

## 2018-05-05 LAB — TSH: TSH: 3.13 u[IU]/mL (ref 0.35–4.50)
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
## 2018-05-05 NOTE — Patient Instructions (Addendum)
Turmeric pills Stretching Health Maintenance for Postmenopausal Women Menopause is a normal process in which your reproductive ability comes to an end. This process happens gradually over a span of months to years, usually between the ages of 6 and 50. Menopause is complete when you have missed 12 consecutive menstrual periods. It is important to talk with your health care provider about some of the most common conditions that affect postmenopausal women, such as heart disease, cancer, and bone loss (osteoporosis). Adopting a healthy lifestyle and getting preventive care can help to promote your health and wellness. Those actions can also lower your chances of developing some of these common conditions. What should I know about menopause? During menopause, you may experience a number of symptoms, such as:  Moderate-to-severe hot flashes.  Night sweats.  Decrease in sex drive.  Mood swings.  Headaches.  Tiredness.  Irritability.  Memory problems.  Insomnia.  Choosing to treat or not to treat menopausal changes is an individual decision that you make with your health care provider. What should I know about hormone replacement therapy and supplements? Hormone therapy products are effective for treating symptoms that are associated with menopause, such as hot flashes and night sweats. Hormone replacement carries certain risks, especially as you become older. If you are thinking about using estrogen or estrogen with progestin treatments, discuss the benefits and risks with your health care provider. What should I know about heart disease and stroke? Heart disease, heart attack, and stroke become more likely as you age. This may be due, in part, to the hormonal changes that your body experiences during menopause. These can affect how your body processes dietary fats, triglycerides, and cholesterol. Heart attack and stroke are both medical emergencies. There are many things that you can do  to help prevent heart disease and stroke:  Have your blood pressure checked at least every 1-2 years. High blood pressure causes heart disease and increases the risk of stroke.  If you are 6-54 years old, ask your health care provider if you should take aspirin to prevent a heart attack or a stroke.  Do not use any tobacco products, including cigarettes, chewing tobacco, or electronic cigarettes. If you need help quitting, ask your health care provider.  It is important to eat a healthy diet and maintain a healthy weight. ? Be sure to include plenty of vegetables, fruits, low-fat dairy products, and lean protein. ? Avoid eating foods that are high in solid fats, added sugars, or salt (sodium).  Get regular exercise. This is one of the most important things that you can do for your health. ? Try to exercise for at least 150 minutes each week. The type of exercise that you do should increase your heart rate and make you sweat. This is known as moderate-intensity exercise. ? Try to do strengthening exercises at least twice each week. Do these in addition to the moderate-intensity exercise.  Know your numbers.Ask your health care provider to check your cholesterol and your blood glucose. Continue to have your blood tested as directed by your health care provider.  What should I know about cancer screening? There are several types of cancer. Take the following steps to reduce your risk and to catch any cancer development as early as possible. Breast Cancer  Practice breast self-awareness. ? This means understanding how your breasts normally appear and feel. ? It also means doing regular breast self-exams. Let your health care provider know about any changes, no matter how small.  If  you are 41 or older, have a clinician do a breast exam (clinical breast exam or CBE) every year. Depending on your age, family history, and medical history, it may be recommended that you also have a yearly breast  X-ray (mammogram).  If you have a family history of breast cancer, talk with your health care provider about genetic screening.  If you are at high risk for breast cancer, talk with your health care provider about having an MRI and a mammogram every year.  Breast cancer (BRCA) gene test is recommended for women who have family members with BRCA-related cancers. Results of the assessment will determine the need for genetic counseling and BRCA1 and for BRCA2 testing. BRCA-related cancers include these types: ? Breast. This occurs in males or females. ? Ovarian. ? Tubal. This may also be called fallopian tube cancer. ? Cancer of the abdominal or pelvic lining (peritoneal cancer). ? Prostate. ? Pancreatic.  Cervical, Uterine, and Ovarian Cancer Your health care provider may recommend that you be screened regularly for cancer of the pelvic organs. These include your ovaries, uterus, and vagina. This screening involves a pelvic exam, which includes checking for microscopic changes to the surface of your cervix (Pap test).  For women ages 21-65, health care providers may recommend a pelvic exam and a Pap test every three years. For women ages 46-65, they may recommend the Pap test and pelvic exam, combined with testing for human papilloma virus (HPV), every five years. Some types of HPV increase your risk of cervical cancer. Testing for HPV may also be done on women of any age who have unclear Pap test results.  Other health care providers may not recommend any screening for nonpregnant women who are considered low risk for pelvic cancer and have no symptoms. Ask your health care provider if a screening pelvic exam is right for you.  If you have had past treatment for cervical cancer or a condition that could lead to cancer, you need Pap tests and screening for cancer for at least 20 years after your treatment. If Pap tests have been discontinued for you, your risk factors (such as having a new sexual  partner) need to be reassessed to determine if you should start having screenings again. Some women have medical problems that increase the chance of getting cervical cancer. In these cases, your health care provider may recommend that you have screening and Pap tests more often.  If you have a family history of uterine cancer or ovarian cancer, talk with your health care provider about genetic screening.  If you have vaginal bleeding after reaching menopause, tell your health care provider.  There are currently no reliable tests available to screen for ovarian cancer.  Lung Cancer Lung cancer screening is recommended for adults 76-72 years old who are at high risk for lung cancer because of a history of smoking. A yearly low-dose CT scan of the lungs is recommended if you:  Currently smoke.  Have a history of at least 30 pack-years of smoking and you currently smoke or have quit within the past 15 years. A pack-year is smoking an average of one pack of cigarettes per day for one year.  Yearly screening should:  Continue until it has been 15 years since you quit.  Stop if you develop a health problem that would prevent you from having lung cancer treatment.  Colorectal Cancer  This type of cancer can be detected and can often be prevented.  Routine colorectal cancer screening  usually begins at age 77 and continues through age 62.  If you have risk factors for colon cancer, your health care provider may recommend that you be screened at an earlier age.  If you have a family history of colorectal cancer, talk with your health care provider about genetic screening.  Your health care provider may also recommend using home test kits to check for hidden blood in your stool.  A small camera at the end of a tube can be used to examine your colon directly (sigmoidoscopy or colonoscopy). This is done to check for the earliest forms of colorectal cancer.  Direct examination of the colon  should be repeated every 5-10 years until age 80. However, if early forms of precancerous polyps or small growths are found or if you have a family history or genetic risk for colorectal cancer, you may need to be screened more often.  Skin Cancer  Check your skin from head to toe regularly.  Monitor any moles. Be sure to tell your health care provider: ? About any new moles or changes in moles, especially if there is a change in a mole's shape or color. ? If you have a mole that is larger than the size of a pencil eraser.  If any of your family members has a history of skin cancer, especially at a young age, talk with your health care provider about genetic screening.  Always use sunscreen. Apply sunscreen liberally and repeatedly throughout the day.  Whenever you are outside, protect yourself by wearing long sleeves, pants, a wide-brimmed hat, and sunglasses.  What should I know about osteoporosis? Osteoporosis is a condition in which bone destruction happens more quickly than new bone creation. After menopause, you may be at an increased risk for osteoporosis. To help prevent osteoporosis or the bone fractures that can happen because of osteoporosis, the following is recommended:  If you are 76-25 years old, get at least 1,000 mg of calcium and at least 600 mg of vitamin D per day.  If you are older than age 5 but younger than age 77, get at least 1,200 mg of calcium and at least 600 mg of vitamin D per day.  If you are older than age 71, get at least 1,200 mg of calcium and at least 800 mg of vitamin D per day.  Smoking and excessive alcohol intake increase the risk of osteoporosis. Eat foods that are rich in calcium and vitamin D, and do weight-bearing exercises several times each week as directed by your health care provider. What should I know about how menopause affects my mental health? Depression may occur at any age, but it is more common as you become older. Common symptoms of  depression include:  Low or sad mood.  Changes in sleep patterns.  Changes in appetite or eating patterns.  Feeling an overall lack of motivation or enjoyment of activities that you previously enjoyed.  Frequent crying spells.  Talk with your health care provider if you think that you are experiencing depression. What should I know about immunizations? It is important that you get and maintain your immunizations. These include:  Tetanus, diphtheria, and pertussis (Tdap) booster vaccine.  Influenza every year before the flu season begins.  Pneumonia vaccine.  Shingles vaccine.  Your health care provider may also recommend other immunizations. This information is not intended to replace advice given to you by your health care provider. Make sure you discuss any questions you have with your health care provider.  Document Released: 02/07/2006 Document Revised: 07/05/2016 Document Reviewed: 09/19/2015 Elsevier Interactive Patient Education  2018 [employer].
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
## 2018-05-05 NOTE — Assessment & Plan Note (Signed)
We discussed age appropriate health related issues, including available/recomended screening tests and vaccinations. We discussed a need for adhering to healthy diet and exercise. Labs/EKG were reviewed/ordered. All questions were answered.  PAP HIV  Irreg periods and hot flashes lately  cologuard vs colonoscopy discussed Labs

## 2018-05-06 ENCOUNTER — Telehealth: Payer: Self-pay | Admitting: Internal Medicine

## 2018-05-06 NOTE — Telephone Encounter (Signed)
Copied from [place] [id number]. Topic: Quick Communication - See Telephone Encounter >> May 06, 2018  1:14 PM Ether Griffins B wrote: CRM for notification. See Telephone encounter for: 05/06/18.  Pt was in office yesterday 05/05/18 and is calling in today to let Dr. Alain Marion know she has decided to do the colo guard

## 2018-05-06 NOTE — Addendum Note (Signed)
Addended by: Karren Cobble on: 05/06/2018 08:12 AM   Modules accepted: Orders

## 2018-05-07 ENCOUNTER — Telehealth: Payer: Self-pay | Admitting: Internal Medicine

## 2018-05-07 ENCOUNTER — Other Ambulatory Visit: Payer: Self-pay | Admitting: Internal Medicine

## 2018-05-07 DIAGNOSIS — R7989 Other specified abnormal findings of blood chemistry: Secondary | ICD-10-CM

## 2018-05-07 LAB — CYTOLOGY - PAP: HPV: NOT DETECTED

## 2018-05-07 NOTE — Telephone Encounter (Signed)
Cologuard ordered

## 2018-05-07 NOTE — Telephone Encounter (Signed)
Pt returned call for lab results. Results and recommendations given to patient with verbal understanding. Pt also said that she had the [place] already scheduled. She does not want to do the colonoscopy and but is requesting to the the [facility] guard. Please advise pt. Provider  Dr. Alain Marion

## 2018-05-07 NOTE — Addendum Note (Signed)
Addended by: Karren Cobble on: 05/07/2018 03:46 PM   Modules accepted: Orders

## 2018-05-14 ENCOUNTER — Other Ambulatory Visit: Payer: Self-pay

## 2018-05-19 ENCOUNTER — Ambulatory Visit
Admission: RE | Admit: 2018-05-19 | Discharge: 2018-05-19 | Disposition: A | Discharge status: Home / Self Care | Payer: BC Managed Care – PPO | Source: Ambulatory Visit | Attending: Internal Medicine | Admitting: Internal Medicine

## 2018-05-19 DIAGNOSIS — R7989 Other specified abnormal findings of blood chemistry: Secondary | ICD-10-CM

## 2018-06-02 ENCOUNTER — Ambulatory Visit
Admission: RE | Admit: 2018-06-02 | Discharge: 2018-06-02 | Disposition: A | Discharge status: Home / Self Care | Payer: BC Managed Care – PPO | Source: Ambulatory Visit | Attending: Internal Medicine | Admitting: Internal Medicine

## 2018-06-29 ENCOUNTER — Encounter: Payer: Self-pay | Admitting: Internal Medicine

## 2018-07-06 ENCOUNTER — Encounter: Payer: Self-pay | Admitting: Gastroenterology

## 2018-07-15 ENCOUNTER — Other Ambulatory Visit: Payer: Self-pay

## 2018-07-15 ENCOUNTER — Ambulatory Visit (AMBULATORY_SURGERY_CENTER): Payer: Self-pay

## 2018-07-15 VITALS — Ht 62.0 in | Wt 184.8 lb

## 2018-07-15 DIAGNOSIS — Z1211 Encounter for screening for malignant neoplasm of colon: Secondary | ICD-10-CM

## 2018-07-15 MED ORDER — SOD PICOSULFATE-MAG OX-CIT ACD 10-3.5-12 MG-GM -GM/160ML PO SOLN: 1.0000 | Freq: Once | ORAL | 0 refills | Status: AC

## 2018-07-15 NOTE — Progress Notes (Signed)
No egg or soy allergy known to patient  Pt has not had any  surgeries  Or procedures.  No diet pills per patient No home 02 use per patient  No blood thinners per patient  Pt denies issues with constipation  No A fib or A flutter  EMMI video sent to pt's e mail   Pt wanted to change from Dr. Lyndel Safe to Dr., Carlean Purl . We changed her appointment to Sept 25, with [facility]. 7-31 colon /gupta canceled

## 2018-07-16 ENCOUNTER — Encounter: Payer: Self-pay | Admitting: Internal Medicine

## 2018-07-29 ENCOUNTER — Encounter: Payer: Self-pay | Admitting: Gastroenterology

## 2018-09-10 ENCOUNTER — Encounter: Payer: Self-pay | Admitting: Internal Medicine

## 2018-09-18 ENCOUNTER — Ambulatory Visit: Payer: BC Managed Care – PPO | Admitting: Internal Medicine

## 2018-09-18 ENCOUNTER — Encounter: Payer: Self-pay | Admitting: Internal Medicine

## 2018-09-18 DIAGNOSIS — G43009 Migraine without aura, not intractable, without status migrainosus: Secondary | ICD-10-CM

## 2018-09-18 DIAGNOSIS — G43909 Migraine, unspecified, not intractable, without status migrainosus: Secondary | ICD-10-CM | POA: Insufficient documentation

## 2018-09-18 DIAGNOSIS — M542 Cervicalgia: Secondary | ICD-10-CM | POA: Diagnosis not present

## 2018-09-18 MED ORDER — PROMETHAZINE HCL 12.5 MG PO TABS: 12.5000 mg | ORAL_TABLET | Freq: Four times a day (QID) | ORAL | 0 refills | Status: DC | PRN

## 2018-09-18 MED ORDER — SUMATRIPTAN SUCCINATE 100 MG PO TABS: 100.0000 mg | ORAL_TABLET | ORAL | 3 refills | Status: DC | PRN

## 2018-09-18 MED ORDER — SUMATRIPTAN SUCCINATE 100 MG PO TABS: 100.0000 mg | ORAL_TABLET | Freq: Once | ORAL | 3 refills | Status: DC

## 2018-09-18 MED ORDER — IBUPROFEN 600 MG PO TABS: ORAL_TABLET | ORAL | 3 refills | Status: DC

## 2018-09-18 NOTE — Assessment & Plan Note (Signed)
Ergonomic work station [facility] Imitrex prn Ibuprofen - rare use Promethazine prn

## 2018-09-18 NOTE — Patient Instructions (Signed)
Migraine Headache A migraine headache is a very strong throbbing pain on one side or both sides of your head. Migraines can also cause other symptoms. Talk with your doctor about what things may bring on (trigger) your migraine headaches. Follow these instructions at home: Medicines  Take over-the-counter and prescription medicines only as told by your doctor.  Do not drive or use heavy machinery while taking prescription pain medicine.  To prevent or treat constipation while you are taking prescription pain medicine, your doctor may recommend that you: ? Drink enough fluid to keep your pee (urine) clear or pale yellow. ? Take over-the-counter or prescription medicines. ? Eat foods that are high in fiber. These include fresh fruits and vegetables, whole grains, and beans. ? Limit foods that are high in fat and processed sugars. These include fried and sweet foods. Lifestyle  Avoid alcohol.  Do not use any products that contain nicotine or tobacco, such as cigarettes and e-cigarettes. If you need help quitting, ask your doctor.  Get at least 8 hours of sleep every night.  Limit your stress. General instructions   Keep a journal to find out what may bring on your migraines. For example, write down: ? What you eat and drink. ? How much sleep you get. ? Any change in what you eat or drink. ? Any change in your medicines.  If you have a migraine: ? Avoid things that make your symptoms worse, such as bright lights. ? It may help to lie down in a dark, quiet room. ? Do not drive or use heavy machinery. ? Ask your doctor what activities are safe for you.  Keep all follow-up visits as told by your doctor. This is important. Contact a doctor if:  You get a migraine that is different or worse than your usual migraines. Get help right away if:  Your migraine gets very bad.  You have a fever.  You have a stiff neck.  You have trouble seeing.  Your muscles feel weak or like you  cannot control them.  You start to lose your balance a lot.  You start to have trouble walking.  You pass out (faint). This information is not intended to replace advice given to you by your health care provider. Make sure you discuss any questions you have with your health care provider. Document Released: 09/24/2008 Document Revised: 07/05/2016 Document Reviewed: 06/03/2016 Elsevier Interactive Patient Education  2018 [employer]. Occipital Neuralgia Occipital neuralgia is a type of headache that causes episodes of very bad pain in the back of your head. Pain from occipital neuralgia may spread (radiate) to other parts of your head. The pain is usually brief and often goes away after you rest and relax. These headaches may be caused by irritation of the nerves that leave your spinal cord high up in your neck, just below the base of your skull (occipital nerves). Your occipital nerves transmit sensations from the back of your head, the top of your head, and the areas behind your ears. What are the causes? Occipital neuralgia can occur without any known cause (primary headache syndrome). In other cases, occipital neuralgia is caused by pressure on or irritation of one of the two occipital nerves. Causes of occipital nerve compression or irritation include:  Wear and tear of the vertebrae in the neck (osteoarthritis).  Neck injury.  Disease of the disks that separate the vertebrae.  Tumors.  Gout.  Infections.  Diabetes.  Swollen blood vessels that put pressure on the  occipital nerves.  Muscle spasm in the neck.  What are the signs or symptoms? Pain is the main symptom of occipital neuralgia. It usually starts in the back of the head but may also be felt in other areas supplied by the occipital nerves. Pain is usually on one side but may be on both sides. You may have:  Brief episodes of very bad pain that is burning, stabbing, shocking, or shooting.  Pain behind the  eye.  Pain triggered by neck movement or hair brushing.  Scalp tenderness.  Aching in the back of the head between episodes of very bad pain.  How is this diagnosed? Your health care provider may diagnose occipital neuralgia based on your symptoms and a physical exam. During the exam, the health care provider may push on areas supplied by the occipital nerves to see if they are painful. Some tests may also be done to help in making the diagnosis. These may include:  Imaging studies of the upper spinal cord, such as an MRI or CT scan. These may show compression or spinal cord abnormalities.  Nerve block. You will get an injection of numbing medicine (local anesthetic) near the occipital nerve to see if this relieves pain.  How is this treated? Treatment may begin with simple measures, such as:  Rest.  Massage.  Heat.  Over-the-counter pain relievers.  If these measures do not work, you may need other treatments, including:  Medicines such as: ? Prescription-strength anti-inflammatory medicines. ? Muscle relaxants. ? Antiseizure medicines. ? Antidepressants.  Steroid injection. This involves injections of local anesthetic and strong anti-inflammatory drugs (steroids).  Pulsed radiofrequency. Wires are implanted to deliver electrical impulses that block pain signals from the occipital nerve.  Physical therapy.  Surgery to relieve nerve pressure.  Follow these instructions at home:  Take all medicines as directed by your health care provider.  Avoid activities that cause pain.  Rest when you have an attack of pain.  Try gentle massage or a heating pad to relieve pain.  Work with a physical therapist to learn stretching exercises you can do at home.  Try a different pillow or sleeping position.  Practice good posture.  Try to stay active. Get regular exercise that does not cause pain. Ask your health care provider to suggest safe exercises for you.  Keep all  follow-up visits as directed by your health care provider. This is important. Contact a health care provider if:  Your medicine is not working.  You have new or worsening symptoms. Get help right away if:  You have very bad head pain that is not going away.  You have a sudden change in vision, balance, or speech. This information is not intended to replace advice given to you by your health care provider. Make sure you discuss any questions you have with your health care provider. Document Released: 12/10/2001 Document Revised: 05/23/2016 Document Reviewed: 12/08/2013 Elsevier Interactive Patient Education  2017 [employer].

## 2018-09-18 NOTE — Progress Notes (Signed)
Subjective:  Patient ID: Belinda Johnson, female    DOB: [birth year]  Age: 52 y.o. MRN: [id number]  CC: No chief complaint on file.   HPI Belinda Johnson presents for  B neck pain worse w/ROM. C/o pain irrad to the occip areas of the head x 2-3 das. Took Tylenol - it helped. Had a HA w/nausea  Outpatient Medications Prior to Visit  Medication Sig Dispense Refill  . acetaminophen (TYLENOL) 650 MG CR tablet Take 650 mg by mouth every 8 (eight) hours as needed for pain.    . cholecalciferol (VITAMIN D) 1000 UNITS tablet Take 1,000 Units by mouth daily.    . Omega-3 Fatty Acids (FISH OIL CONCENTRATE PO) Take by mouth.     No facility-administered medications prior to visit.     ROS: Review of Systems  Constitutional: Negative for activity change, appetite change, chills, fatigue and unexpected weight change.  HENT: Negative for congestion, mouth sores and sinus pressure.   Eyes: Negative for visual disturbance.  Respiratory: Negative for cough and chest tightness.   Gastrointestinal: Negative for abdominal pain and nausea.  Genitourinary: Negative for difficulty urinating, frequency and vaginal pain.  Musculoskeletal: Positive for neck pain. Negative for back pain and gait problem.  Skin: Negative for pallor and rash.  Neurological: Positive for headaches. Negative for dizziness, tremors, weakness and numbness.  Psychiatric/Behavioral: Negative for confusion and sleep disturbance.    Objective:  BP 118/76 (BP Location: Left Arm, Patient Position: Sitting, Cuff Size: Large)   Pulse 76   Temp 98.3 F (36.8 C) (Oral)   Ht 5\' 2"  (1.575 m)   Wt 188 lb (85.3 kg)   SpO2 99%   BMI 34.39 kg/m   BP Readings from Last 3 Encounters:  09/18/18 118/76  05/05/18 116/82  04/06/18 120/88    Wt Readings from Last 3 Encounters:  09/18/18 188 lb (85.3 kg)  07/15/18 184 lb 12.8 oz (83.8 kg)  05/05/18 181 lb (82.1 kg)    Physical Exam  Constitutional: She appears well-developed. No  distress.  HENT:  Head: Normocephalic.  Right Ear: External ear normal.  Left Ear: External ear normal.  Nose: Nose normal.  Mouth/Throat: Oropharynx is clear and moist.  Eyes: Pupils are equal, round, and reactive to light. Conjunctivae are normal. Right eye exhibits no discharge. Left eye exhibits no discharge.  Neck: Normal range of motion. Neck supple. No JVD present. No tracheal deviation present. No thyromegaly present.  Cardiovascular: Normal rate, regular rhythm and normal heart sounds.  Pulmonary/Chest: No stridor. No respiratory distress. She has no wheezes.  Abdominal: Soft. Bowel sounds are normal. She exhibits no distension and no mass. There is no tenderness. There is no rebound and no guarding.  Musculoskeletal: She exhibits tenderness. She exhibits no edema.  Lymphadenopathy:    She has no cervical adenopathy.  Neurological: She displays normal reflexes. No cranial nerve deficit. She exhibits normal muscle tone. Coordination normal.  Skin: No rash noted. No erythema.  Psychiatric: She has a normal mood and affect. Her behavior is normal. Judgment and thought content normal.  neck tender w/ROM Skull base is tender - occp nerves  Lab Results  Component Value Date   WBC 6.8 04/06/2018   HGB 13.2 04/06/2018   HCT 39.5 04/06/2018   PLT 289.0 04/06/2018   GLUCOSE 82 05/05/2018   CHOL 155 05/05/2018   TRIG 63.0 05/05/2018   HDL 64.40 05/05/2018   LDLCALC 78 05/05/2018   ALT 14 05/05/2018   AST 19  05/05/2018   NA 141 05/05/2018   K 4.0 05/05/2018   CL 104 05/05/2018   CREATININE 1.35 (H) 05/05/2018   BUN 19 05/05/2018   CO2 31 05/05/2018   TSH 3.13 05/05/2018    Mm 3d Screen Breast Bilateral  Result Date: 06/02/2018 CLINICAL DATA:  Screening. EXAM: DIGITAL SCREENING BILATERAL MAMMOGRAM WITH TOMO AND CAD COMPARISON:  Previous exam(s). ACR Breast Density Category b: There are scattered areas of fibroglandular density. FINDINGS: There are no findings suspicious for  malignancy. Images were processed with CAD. IMPRESSION: No mammographic evidence of malignancy. A result letter of this screening mammogram will be mailed directly to the patient. RECOMMENDATION: Screening mammogram in one year. (Code:SM-B-01Y) BI-RADS CATEGORY  1: Negative. Electronically Signed   By: Claudie Revering M.D.   On: 06/02/2018 16:31    Assessment & Plan:   There are no diagnoses linked to this encounter.   No orders of the defined types were placed in this encounter.    Follow-up: No follow-ups on file.  Walker Kehr, MD

## 2018-09-23 ENCOUNTER — Encounter: Payer: Self-pay | Admitting: Internal Medicine

## 2018-09-24 ENCOUNTER — Encounter: Payer: Self-pay | Admitting: Internal Medicine

## 2019-07-28 ENCOUNTER — Other Ambulatory Visit: Payer: Self-pay | Admitting: Internal Medicine

## 2019-07-28 DIAGNOSIS — Z1231 Encounter for screening mammogram for malignant neoplasm of breast: Secondary | ICD-10-CM

## 2019-08-10 ENCOUNTER — Encounter: Payer: Self-pay | Admitting: Internal Medicine

## 2019-08-10 ENCOUNTER — Ambulatory Visit (INDEPENDENT_AMBULATORY_CARE_PROVIDER_SITE_OTHER): Payer: BC Managed Care – PPO | Admitting: Internal Medicine

## 2019-08-10 ENCOUNTER — Ambulatory Visit: Payer: BC Managed Care – PPO | Admitting: Family

## 2019-08-10 DIAGNOSIS — M542 Cervicalgia: Secondary | ICD-10-CM

## 2019-08-10 MED ORDER — CYCLOBENZAPRINE HCL 5 MG PO TABS: 5.0000 mg | ORAL_TABLET | Freq: Two times a day (BID) | ORAL | 0 refills | Status: DC | PRN

## 2019-08-10 NOTE — Progress Notes (Signed)
Virtual Visit via Video Note  I connected with Belinda Johnson on [birth year] at  1:20 PM EDT by a video enabled telemedicine application and verified that I am speaking with the correct person using two identifiers.  The patient and the provider were at separate locations throughout the entire encounter.   I discussed the limitations of evaluation and management by telemedicine and the availability of in person appointments. The patient expressed understanding and agreed to proceed.  History of Present Illness: The patient is a 53 y.o. female with visit for neck pain and going into her head. Feels like it starts at the base of the skull and goes around into her head. Started weeks ago. Has had this in the past and took cyclobenzaprine which did help this to go away. 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 She suspects that this is the cause. She denies headaches, fevers, chills, SOB, muscle aches. Denies numbness in arms or pain radiating into the arms. Denies injury or overuse recently. Overall it is stable. Has tried nothing for it.  Observations/Objective: Appearance: normal, breathing appears normal, casual grooming, abdomen does not appear distended, throat normal,, mental status is A and O times 3  Assessment and Plan: See problem oriented charting  Follow Up Instructions: rx flexeril and advised to move around throughout the day to help with muscle tension from working on computer  Visit time 25 minutes: greater than 50% of that time was spent in face to face counseling and coordination of care with the patient: counseled about etiology of her neck and head pain as well as suggestions to avoid neck strain with working on computer all day.  I discussed the assessment and treatment plan with the patient. The patient was provided an opportunity to ask questions and all were answered. The patient agreed with the plan and demonstrated an understanding of the instructions.   The patient was advised to call back or seek an in-person evaluation if the symptoms worsen or if the condition fails to improve as anticipated.  Hoyt Koch, MD

## 2019-08-10 NOTE — Assessment & Plan Note (Signed)
Rx cyclobenzaprine as this has helped in the past. No worrisome or radicular symptoms today to suggest need for x-ray or other workup.

## 2019-08-31 ENCOUNTER — Ambulatory Visit (INDEPENDENT_AMBULATORY_CARE_PROVIDER_SITE_OTHER): Payer: BC Managed Care – PPO | Admitting: Internal Medicine

## 2019-08-31 ENCOUNTER — Other Ambulatory Visit (INDEPENDENT_AMBULATORY_CARE_PROVIDER_SITE_OTHER): Payer: BC Managed Care – PPO

## 2019-08-31 ENCOUNTER — Encounter: Payer: Self-pay | Admitting: Internal Medicine

## 2019-08-31 ENCOUNTER — Other Ambulatory Visit: Payer: Self-pay

## 2019-08-31 ENCOUNTER — Ambulatory Visit (INDEPENDENT_AMBULATORY_CARE_PROVIDER_SITE_OTHER)
Admission: RE | Admit: 2019-08-31 | Discharge: 2019-08-31 | Disposition: A | Discharge status: Home / Self Care | Payer: BC Managed Care – PPO | Source: Ambulatory Visit | Attending: Internal Medicine | Admitting: Internal Medicine

## 2019-08-31 VITALS — BP 120/80 | HR 98 | Temp 99.5°F | Ht 62.0 in | Wt 193.0 lb

## 2019-08-31 DIAGNOSIS — R202 Paresthesia of skin: Secondary | ICD-10-CM | POA: Diagnosis not present

## 2019-08-31 DIAGNOSIS — M542 Cervicalgia: Secondary | ICD-10-CM | POA: Diagnosis not present

## 2019-08-31 DIAGNOSIS — Z0000 Encounter for general adult medical examination without abnormal findings: Secondary | ICD-10-CM

## 2019-08-31 LAB — LIPID PANEL
Cholesterol: 143 mg/dL (ref 0–200)
HDL: 54.6 mg/dL (ref >39.00)
LDL Cholesterol: 71 mg/dL (ref 0–99)
NonHDL: 88.23
Total CHOL/HDL Ratio: 3
Triglycerides: 88 mg/dL (ref 0.0–149.0)
VLDL: 17.6 mg/dL (ref 0.0–40.0)

## 2019-08-31 LAB — CBC WITH DIFFERENTIAL/PLATELET
Basophils Absolute: 0.1 10*3/uL (ref 0.0–0.1)
Basophils Relative: 1.1 % (ref 0.0–3.0)
Eosinophils Absolute: 0.1 10*3/uL (ref 0.0–0.7)
Eosinophils Relative: 2.1 % (ref 0.0–5.0)
HCT: 40.6 % (ref 36.0–46.0)
Hemoglobin: 13.6 g/dL (ref 12.0–15.0)
Lymphocytes Relative: 48.9 % — ABNORMAL HIGH (ref 12.0–46.0)
Lymphs Abs: 3 10*3/uL (ref 0.7–4.0)
MCHC: 33.4 g/dL (ref 30.0–36.0)
MCV: 86 fl (ref 78.0–100.0)
Monocytes Absolute: 0.3 10*3/uL (ref 0.1–1.0)
Monocytes Relative: 5.2 % (ref 3.0–12.0)
Neutro Abs: 2.6 10*3/uL (ref 1.4–7.7)
Neutrophils Relative %: 42.7 % — ABNORMAL LOW (ref 43.0–77.0)
Platelets: 243 10*3/uL (ref 150.0–400.0)
RBC: 4.73 Mil/uL (ref 3.87–5.11)
RDW: 13.6 % (ref 11.5–15.5)
WBC: 6.2 10*3/uL (ref 4.0–10.5)

## 2019-08-31 LAB — HEPATIC FUNCTION PANEL
ALT: 32 U/L (ref 0–35)
AST: 26 U/L (ref 0–37)
Albumin: 4.2 g/dL (ref 3.5–5.2)
Alkaline Phosphatase: 66 U/L (ref 39–117)
Bilirubin, Direct: 0 mg/dL (ref 0.0–0.3)
Total Bilirubin: 0.3 mg/dL (ref 0.2–1.2)
Total Protein: 6.9 g/dL (ref 6.0–8.3)

## 2019-08-31 LAB — BASIC METABOLIC PANEL
BUN: 16 mg/dL (ref 6–23)
CO2: 27 mEq/L (ref 19–32)
Calcium: 9.5 mg/dL (ref 8.4–10.5)
Chloride: 104 mEq/L (ref 96–112)
Creatinine, Ser: 1.38 mg/dL — ABNORMAL HIGH (ref 0.40–1.20)
GFR: 48.58 mL/min — ABNORMAL LOW (ref >60.00)
Glucose, Bld: 101 mg/dL — ABNORMAL HIGH (ref 70–99)
Potassium: 4.2 mEq/L (ref 3.5–5.1)
Sodium: 140 mEq/L (ref 135–145)

## 2019-08-31 LAB — URINALYSIS, ROUTINE W REFLEX MICROSCOPIC
Bilirubin Urine: NEGATIVE
Hgb urine dipstick: NEGATIVE
Ketones, ur: NEGATIVE
Nitrite: NEGATIVE
Specific Gravity, Urine: 1.02 (ref 1.000–1.030)
Total Protein, Urine: NEGATIVE
Urine Glucose: NEGATIVE
Urobilinogen, UA: 0.2 (ref 0.0–1.0)
pH: 5.5 (ref 5.0–8.0)

## 2019-08-31 LAB — TSH: TSH: 3.68 u[IU]/mL (ref 0.35–4.50)

## 2019-08-31 LAB — VITAMIN B12: Vitamin B-12: 460 pg/mL (ref 211–911)

## 2019-08-31 MED ORDER — MELOXICAM 15 MG PO TABS: 15.0000 mg | ORAL_TABLET | Freq: Every day | ORAL | 0 refills | Status: DC

## 2019-08-31 MED ORDER — CYCLOBENZAPRINE HCL 5 MG PO TABS: 5.0000 mg | ORAL_TABLET | Freq: Two times a day (BID) | ORAL | 0 refills | Status: DC | PRN

## 2019-08-31 NOTE — Progress Notes (Signed)
Subjective:  Patient ID: Belinda Johnson, female    DOB: 02-09-1966  Age: 53 y.o. MRN: [id number]  CC: No chief complaint on file.   HPI Belinda Johnson presents for well exam C/o neck pain - worse w/sitting at the desk - crawling  Feeling in the head, neck, shoulders and legs x 3 weeks. Flexeril helped in the past  Outpatient Medications Prior to Visit  Medication Sig Dispense Refill  . acetaminophen (TYLENOL) 650 MG CR tablet Take 650 mg by mouth every 8 (eight) hours as needed for pain.    . cholecalciferol (VITAMIN D) 1000 UNITS tablet Take 1,000 Units by mouth daily.    . cyclobenzaprine (FLEXERIL) 5 MG tablet Take 1 tablet (5 mg total) by mouth 2 (two) times daily as needed for muscle spasms. 60 tablet 0  . Omega-3 Fatty Acids (FISH OIL CONCENTRATE PO) Take by mouth.     No facility-administered medications prior to visit.     ROS: Review of Systems  Constitutional: Negative for activity change, appetite change, chills, fatigue and unexpected weight change.  HENT: Negative for congestion, mouth sores and sinus pressure.   Eyes: Negative for visual disturbance.  Respiratory: Negative for cough and chest tightness.   Gastrointestinal: Negative for abdominal pain and nausea.  Genitourinary: Negative for difficulty urinating, frequency and vaginal pain.  Musculoskeletal: Positive for neck pain and neck stiffness. Negative for back pain and gait problem.  Skin: Negative for pallor and rash.  Neurological: Positive for numbness. Negative for dizziness, tremors, weakness and headaches.  Psychiatric/Behavioral: Negative for confusion, sleep disturbance and suicidal ideas.    Objective:  BP 120/80 (BP Location: Left Arm, Patient Position: Sitting, Cuff Size: Large)   Pulse 98   Temp 99.5 F (37.5 C) (Oral)   Ht 5\' 2"  (1.575 m)   Wt 193 lb (87.5 kg)   SpO2 97%   BMI 35.30 kg/m   BP Readings from Last 3 Encounters:  08/31/19 120/80  09/18/18 118/76  05/05/18 116/82     Wt Readings from Last 3 Encounters:  08/31/19 193 lb (87.5 kg)  09/18/18 188 lb (85.3 kg)  07/15/18 184 lb 12.8 oz (83.8 kg)    Physical Exam Constitutional:      General: She is not in acute distress.    Appearance: She is well-developed.  HENT:     Head: Normocephalic.     Right Ear: External ear normal.     Left Ear: External ear normal.     Nose: Nose normal.  Eyes:     General:        Right eye: No discharge.        Left eye: No discharge.     Conjunctiva/sclera: Conjunctivae normal.     Pupils: Pupils are equal, round, and reactive to light.  Neck:     Musculoskeletal: Normal range of motion and neck supple.     Thyroid: No thyromegaly.     Vascular: No JVD.     Trachea: No tracheal deviation.  Cardiovascular:     Rate and Rhythm: Normal rate and regular rhythm.     Heart sounds: Normal heart sounds.  Pulmonary:     Effort: No respiratory distress.     Breath sounds: No stridor. No wheezing.  Abdominal:     General: Bowel sounds are normal. There is no distension.     Palpations: Abdomen is soft. There is no mass.     Tenderness: There is no abdominal tenderness. There is no  guarding or rebound.  Musculoskeletal:        General: No tenderness.  Lymphadenopathy:     Cervical: No cervical adenopathy.  Skin:    Findings: No erythema or rash.  Neurological:     Cranial Nerves: No cranial nerve deficit.     Motor: No abnormal muscle tone.     Coordination: Coordination normal.     Deep Tendon Reflexes: Reflexes normal.  Psychiatric:        Behavior: Behavior normal.        Thought Content: Thought content normal.        Judgment: Judgment normal.     Neck and shoulders - tense Neuro - nonfocal  Lab Results  Component Value Date   WBC 6.8 04/06/2018   HGB 13.2 04/06/2018   HCT 39.5 04/06/2018   PLT 289.0 04/06/2018   GLUCOSE 82 05/05/2018   CHOL 155 05/05/2018   TRIG 63.0 05/05/2018   HDL 64.40 05/05/2018   LDLCALC 78 05/05/2018   ALT 14 05/05/2018    AST 19 05/05/2018   NA 141 05/05/2018   K 4.0 05/05/2018   CL 104 05/05/2018   CREATININE 1.35 (H) 05/05/2018   BUN 19 05/05/2018   CO2 31 05/05/2018   TSH 3.13 05/05/2018    Mm 3d Screen Breast Bilateral  Result Date: 06/02/2018 CLINICAL DATA:  Screening. EXAM: DIGITAL SCREENING BILATERAL MAMMOGRAM WITH TOMO AND CAD COMPARISON:  Previous exam(s). ACR Breast Density Category b: There are scattered areas of fibroglandular density. FINDINGS: There are no findings suspicious for malignancy. Images were processed with CAD. IMPRESSION: No mammographic evidence of malignancy. A result letter of this screening mammogram will be mailed directly to the patient. RECOMMENDATION: Screening mammogram in one year. (Code:SM-B-01Y) BI-RADS CATEGORY  1: Negative. Electronically Signed   By: Claudie Revering M.D.   On: 06/02/2018 16:31    Assessment & Plan:   There are no diagnoses linked to this encounter.   No orders of the defined types were placed in this encounter.    Follow-up: No follow-ups on file.  Walker Kehr, MD

## 2019-08-31 NOTE — Assessment & Plan Note (Addendum)
X ray C spine PT meloxicam Flexeril Off work x 1 week

## 2019-08-31 NOTE — Patient Instructions (Signed)
Rice sock 

## 2019-09-01 ENCOUNTER — Other Ambulatory Visit: Payer: Self-pay | Admitting: Internal Medicine

## 2019-09-01 ENCOUNTER — Telehealth: Payer: Self-pay | Admitting: *Deleted

## 2019-09-01 DIAGNOSIS — R7989 Other specified abnormal findings of blood chemistry: Secondary | ICD-10-CM

## 2019-09-01 DIAGNOSIS — Z1211 Encounter for screening for malignant neoplasm of colon: Secondary | ICD-10-CM

## 2019-09-01 NOTE — Telephone Encounter (Signed)
Patient states she needing to have a colonoscopy. Referral placed.

## 2019-09-01 NOTE — Assessment & Plan Note (Signed)
In the neck, head, arms: ?Immunologist work Occupational hygienist PT F/u w/me

## 2019-09-03 ENCOUNTER — Telehealth: Payer: Self-pay | Admitting: *Deleted

## 2019-09-03 ENCOUNTER — Encounter: Payer: Self-pay | Admitting: Internal Medicine

## 2019-09-03 NOTE — Telephone Encounter (Signed)
Copied from [place] [id number]. Topic: General - Inquiry >> Sep 03, 2019  2:45 PM Belinda Johnson wrote: Reason for CRM:   Pt states that she had a test done last week and she needs to know what the results are because she feels that something is wrong with her kidneys. Pt can be reached at [phone]

## 2019-09-07 NOTE — Telephone Encounter (Signed)
I called pt- she is requesting 08/31/19 labs and xray results be mailed to her. Copies mailed to pt.

## 2019-09-09 ENCOUNTER — Ambulatory Visit
Admission: RE | Admit: 2019-09-09 | Discharge: 2019-09-09 | Disposition: A | Discharge status: Home / Self Care | Payer: BC Managed Care – PPO | Source: Ambulatory Visit | Attending: Internal Medicine | Admitting: Internal Medicine

## 2019-09-09 ENCOUNTER — Other Ambulatory Visit: Payer: Self-pay

## 2019-09-09 DIAGNOSIS — Z1231 Encounter for screening mammogram for malignant neoplasm of breast: Secondary | ICD-10-CM

## 2019-09-13 ENCOUNTER — Other Ambulatory Visit: Payer: Self-pay | Admitting: Internal Medicine

## 2019-09-13 DIAGNOSIS — R928 Other abnormal and inconclusive findings on diagnostic imaging of breast: Secondary | ICD-10-CM

## 2019-09-17 ENCOUNTER — Ambulatory Visit
Admission: RE | Admit: 2019-09-17 | Discharge: 2019-09-17 | Disposition: A | Discharge status: Home / Self Care | Payer: BC Managed Care – PPO | Source: Ambulatory Visit | Attending: Internal Medicine | Admitting: Internal Medicine

## 2019-09-17 ENCOUNTER — Other Ambulatory Visit: Payer: Self-pay

## 2019-09-17 ENCOUNTER — Other Ambulatory Visit: Payer: Self-pay | Admitting: Internal Medicine

## 2019-09-17 DIAGNOSIS — N6320 Unspecified lump in the left breast, unspecified quadrant: Secondary | ICD-10-CM

## 2019-09-17 DIAGNOSIS — R928 Other abnormal and inconclusive findings on diagnostic imaging of breast: Secondary | ICD-10-CM

## 2019-09-21 ENCOUNTER — Other Ambulatory Visit: Payer: Self-pay

## 2019-09-21 ENCOUNTER — Ambulatory Visit (AMBULATORY_SURGERY_CENTER): Payer: Self-pay | Admitting: *Deleted

## 2019-09-21 VITALS — Temp 96.9°F | Ht 65.0 in | Wt 193.0 lb

## 2019-09-21 DIAGNOSIS — Z1211 Encounter for screening for malignant neoplasm of colon: Secondary | ICD-10-CM

## 2019-09-21 MED ORDER — SUPREP BOWEL PREP KIT 17.5-3.13-1.6 GM/177ML PO SOLN: 1.0000 | Freq: Once | ORAL | 0 refills | Status: AC

## 2019-09-21 NOTE — Progress Notes (Signed)

## 2019-09-27 ENCOUNTER — Encounter: Payer: Self-pay | Admitting: Internal Medicine

## 2019-09-28 ENCOUNTER — Other Ambulatory Visit: Payer: Self-pay | Admitting: Internal Medicine

## 2019-10-05 ENCOUNTER — Encounter: Payer: BC Managed Care – PPO | Admitting: Internal Medicine

## 2019-10-15 ENCOUNTER — Telehealth: Payer: Self-pay | Admitting: Internal Medicine

## 2019-10-15 NOTE — Telephone Encounter (Signed)

## 2019-10-18 ENCOUNTER — Encounter: Payer: Self-pay | Admitting: Internal Medicine

## 2019-10-18 ENCOUNTER — Ambulatory Visit (AMBULATORY_SURGERY_CENTER): Payer: BC Managed Care – PPO | Admitting: Internal Medicine

## 2019-10-18 ENCOUNTER — Other Ambulatory Visit: Payer: Self-pay

## 2019-10-18 VITALS — BP 106/76 | HR 77 | Temp 98.7°F | Resp 16 | Ht 65.0 in | Wt 193.0 lb

## 2019-10-18 DIAGNOSIS — Z1211 Encounter for screening for malignant neoplasm of colon: Secondary | ICD-10-CM | POA: Diagnosis present

## 2019-10-18 MED ORDER — SODIUM CHLORIDE 0.9 % IV SOLN: 500.0000 mL | Freq: Once | INTRAVENOUS | Status: DC

## 2019-10-18 NOTE — Op Note (Signed)
[facility] Patient Name: Belinda Johnson Procedure Date: 10/18/2019 11:32 AM MRN: [id number] Endoscopist: Jerene Bears , MD Age: 53 Referring MD:  Date of Birth: 12/12/1966 Gender: Female Account #: [id number] Procedure:                Colonoscopy Indications:              Screening for colorectal malignant neoplasm, This                            is the patient's first colonoscopy Medicines:                Monitored Anesthesia Care Procedure:                Pre-Anesthesia Assessment:                           - Prior to the procedure, a History and Physical                            was performed, and patient medications and                            allergies were reviewed. The patient's tolerance of                            previous anesthesia was also reviewed. The risks                            and benefits of the procedure and the sedation                            options and risks were discussed with the patient.                            All questions were answered, and informed consent                            was obtained. Prior Anticoagulants: The patient has                            taken no previous anticoagulant or antiplatelet                            agents. ASA Grade Assessment: II - A patient with                            mild systemic disease. After reviewing the risks                            and benefits, the patient was deemed in                            satisfactory condition to undergo the procedure.  After obtaining informed consent, the colonoscope                            was passed under direct vision. Throughout the                            procedure, the patient's blood pressure, pulse, and                            oxygen saturations were monitored continuously. The                            Colonoscope was introduced through the anus and                            advanced to the cecum,  identified by appendiceal                            orifice and ileocecal valve. The colonoscopy was                            performed without difficulty. The patient tolerated                            the procedure well. The quality of the bowel                            preparation was excellent. The ileocecal valve,                            appendiceal orifice, and rectum were photographed. Scope In: 11:35:53 AM Scope Out: 11:48:45 AM Scope Withdrawal Time: 0 hours 10 minutes 38 seconds  Total Procedure Duration: 0 hours 12 minutes 52 seconds  Findings:                 The digital rectal exam was normal.                           The colon (entire examined portion) appeared normal.                           Internal hemorrhoids were found during                            retroflexion. The hemorrhoids were small. Complications:            No immediate complications. Estimated Blood Loss:     Estimated blood loss: none. Impression:               - The entire examined colon is normal.                           - Small internal hemorrhoids.                           - No specimens collected. Recommendation:           -  Patient has a contact number available for                            emergencies. The signs and symptoms of potential                            delayed complications were discussed with the                            patient. Return to normal activities tomorrow.                            Written discharge instructions were provided to the                            patient.                           - Resume previous diet.                           - Continue present medications.                           - Repeat colonoscopy in 10 years for screening                            purposes. Jerene Bears, MD 10/18/2019 11:51:28 AM This report has been signed electronically.

## 2019-10-18 NOTE — Progress Notes (Signed)
PT taken to PACU. Monitors in place. VSS. Report given to RN. 

## 2019-10-18 NOTE — Patient Instructions (Signed)
Please read handouts provided. Continue present medications.     YOU HAD AN ENDOSCOPIC PROCEDURE TODAY AT THE [facility]:   Refer to the procedure report that was given to you for any specific questions about what was found during the examination.  If the procedure report does not answer your questions, please call your gastroenterologist to clarify.  If you requested that your care partner not be given the details of your procedure findings, then the procedure report has been included in a sealed envelope for you to review at your convenience later.  YOU SHOULD EXPECT: Some feelings of bloating in the abdomen. Passage of more gas than usual.  Walking can help get rid of the air that was put into your GI tract during the procedure and reduce the bloating. If you had a lower endoscopy (such as a colonoscopy or flexible sigmoidoscopy) you may notice spotting of blood in your stool or on the toilet paper. If you underwent a bowel prep for your procedure, you may not have a normal bowel movement for a few days.  Please Note:  You might notice some irritation and congestion in your nose or some drainage.  This is from the oxygen used during your procedure.  There is no need for concern and it should clear up in a day or so.  SYMPTOMS TO REPORT IMMEDIATELY:   Following lower endoscopy (colonoscopy or flexible sigmoidoscopy):  Excessive amounts of blood in the stool  Significant tenderness or worsening of abdominal pains  Swelling of the abdomen that is new, acute  Fever of 100F or higher   For urgent or emergent issues, a gastroenterologist can be reached at any hour by calling [phone].   DIET:  We do recommend a small meal at first, but then you may proceed to your regular diet.  Drink plenty of fluids but you should avoid alcoholic beverages for 24 hours.  ACTIVITY:  You should plan to take it easy for the rest of today and you should NOT DRIVE or use heavy machinery  until tomorrow (because of the sedation medicines used during the test).    FOLLOW UP: Our staff will call the number listed on your records 48-72 hours following your procedure to check on you and address any questions or concerns that you may have regarding the information given to you following your procedure. If we do not reach you, we will leave a message.  We will attempt to reach you two times.  During this call, we will ask if you have developed any symptoms of COVID 19. If you develop any symptoms (ie: fever, flu-like symptoms, shortness of breath, cough etc.) before then, please call [phone].  If you test positive for Covid 19 in the 2 weeks post procedure, please call and report this information to us.    If any biopsies were taken you will be contacted by phone or by letter within the next 1-3 weeks.  Please call us at [phone] if you have not heard about the biopsies in 3 weeks.    SIGNATURES/CONFIDENTIALITY: You and/or your care partner have signed paperwork which will be entered into your electronic medical record.  These signatures attest to the fact that that the information above on your After Visit Summary has been reviewed and is understood.  Full responsibility of the confidentiality of this discharge information lies with you and/or your care-partner. 

## 2019-10-18 NOTE — Progress Notes (Signed)
Pt's states no medical or surgical changes since previsit or office visit.  Temp taken by LS VS taken by CW

## 2019-10-20 ENCOUNTER — Telehealth: Payer: Self-pay | Admitting: *Deleted

## 2019-10-20 NOTE — Telephone Encounter (Signed)
  Follow up Call-  Call back number 10/18/2019  Post procedure Call Back phone  # [phone]  Permission to leave phone message Yes  Some recent data might be hidden     Patient questions:  Do you have a fever, pain , or abdominal swelling? No. Pain Score  0 *  Have you tolerated food without any problems? Yes.    Have you been able to return to your normal activities? Yes.    Do you have any questions about your discharge instructions: Diet   No. Medications  No. Follow up visit  No.  Do you have questions or concerns about your Care? No.  Actions: * If pain score is 4 or above: No action needed, pain <4.  1. Have you developed a fever since your procedure? NO  2.   Have you had an respiratory symptoms (SOB or cough) since your procedure? NO  3.   Have you tested positive for COVID 19 since your procedure NO  4.   Have you had any family members/close contacts diagnosed with the COVID 19 since your procedure?  NO  If yes to any of these questions please route to Joylene John, RN and Alphonsa Gin, RN.

## 2020-03-14 ENCOUNTER — Ambulatory Visit: Payer: BC Managed Care – PPO | Attending: Family

## 2020-03-14 DIAGNOSIS — Z23 Encounter for immunization: Secondary | ICD-10-CM

## 2020-03-14 NOTE — Progress Notes (Signed)
   Covid-19 Vaccination Clinic  Name:  Belinda Johnson    MRN: [id number] DOB: [birth year]  03/14/2020  Belinda Johnson was observed post Covid-19 immunization for 15 minutes without incident. She was provided with Vaccine Information Sheet and instruction to access the V-Safe system.   Belinda Johnson was instructed to call 911 with any severe reactions post vaccine: Marland Kitchen Difficulty breathing  . Swelling of face and throat  . A fast heartbeat  . A bad rash all over body  . Dizziness and weakness   Immunizations Administered    Name Date Dose VIS Date Route   Moderna COVID-19 Vaccine 03/14/2020  4:44 PM 0.5 mL 11/30/2019 Intramuscular   Manufacturer: Moderna   Lot: [id number]   [id number]

## 2020-03-17 ENCOUNTER — Other Ambulatory Visit: Payer: BC Managed Care – PPO

## 2020-04-11 ENCOUNTER — Ambulatory Visit: Payer: BC Managed Care – PPO | Attending: Family

## 2020-04-11 DIAGNOSIS — Z23 Encounter for immunization: Secondary | ICD-10-CM

## 2020-04-11 NOTE — Progress Notes (Signed)
   Covid-19 Vaccination Clinic  Name:  Belinda Johnson    MRN: [id number] DOB: 28-Sep-1966  04/11/2020  [name] was observed post Covid-19 immunization for 15 minutes without incident. She was provided with Vaccine Information Sheet and instruction to access the V-Safe system.   [name] was instructed to call 911 with any severe reactions post vaccine: Marland Kitchen Difficulty breathing  . Swelling of face and throat  . A fast heartbeat  . A bad rash all over body  . Dizziness and weakness   Immunizations Administered    Name Date Dose VIS Date Route   Moderna COVID-19 Vaccine 04/11/2020  4:21 PM 0.5 mL 11/30/2019 Intramuscular   Manufacturer: Moderna   Lot: [id number]   [id number]

## 2020-04-18 ENCOUNTER — Ambulatory Visit: Payer: BC Managed Care – PPO

## 2020-06-23 ENCOUNTER — Other Ambulatory Visit: Payer: BC Managed Care – PPO

## 2020-06-23 ENCOUNTER — Inpatient Hospital Stay: Admission: RE | Admit: 2020-06-23 | Payer: BC Managed Care – PPO | Source: Ambulatory Visit

## 2020-07-24 ENCOUNTER — Other Ambulatory Visit: Payer: Self-pay

## 2020-07-24 ENCOUNTER — Other Ambulatory Visit: Payer: BC Managed Care – PPO

## 2020-07-24 ENCOUNTER — Ambulatory Visit
Admission: RE | Admit: 2020-07-24 | Discharge: 2020-07-24 | Disposition: A | Discharge status: Home / Self Care | Payer: BC Managed Care – PPO | Source: Ambulatory Visit | Attending: Internal Medicine | Admitting: Internal Medicine

## 2020-07-24 ENCOUNTER — Ambulatory Visit: Payer: BC Managed Care – PPO

## 2020-07-24 ENCOUNTER — Other Ambulatory Visit: Payer: Self-pay | Admitting: Internal Medicine

## 2020-07-24 DIAGNOSIS — N6320 Unspecified lump in the left breast, unspecified quadrant: Secondary | ICD-10-CM

## 2020-09-07 ENCOUNTER — Other Ambulatory Visit: Payer: Self-pay | Admitting: Internal Medicine

## 2020-09-07 DIAGNOSIS — N6320 Unspecified lump in the left breast, unspecified quadrant: Secondary | ICD-10-CM

## 2020-09-11 ENCOUNTER — Other Ambulatory Visit: Payer: BC Managed Care – PPO

## 2020-09-19 ENCOUNTER — Other Ambulatory Visit: Payer: Self-pay

## 2020-09-19 ENCOUNTER — Ambulatory Visit
Admission: RE | Admit: 2020-09-19 | Discharge: 2020-09-19 | Disposition: A | Discharge status: Home / Self Care | Payer: BC Managed Care – PPO | Source: Ambulatory Visit | Attending: Internal Medicine | Admitting: Internal Medicine

## 2020-09-19 ENCOUNTER — Ambulatory Visit: Payer: BC Managed Care – PPO

## 2020-09-19 DIAGNOSIS — N6320 Unspecified lump in the left breast, unspecified quadrant: Secondary | ICD-10-CM

## 2020-10-16 IMAGING — MG DIGITAL DIAGNOSTIC BILAT W/ TOMO W/ CAD
8 series · 8 of 24 positions shown · non-contrast
Comparison: Previous exams including most diagnostic mammograms
dated 07/24/2020 and 09/17/2019 and screening mammogram dated
09/09/2019.

CLINICAL DATA: Follow-up for probably benign LEFT breast asymmetry.

EXAM:
DIGITAL DIAGNOSTIC BILATERAL MAMMOGRAM WITH TOMO AND CAD

[R MLO synth-2D]
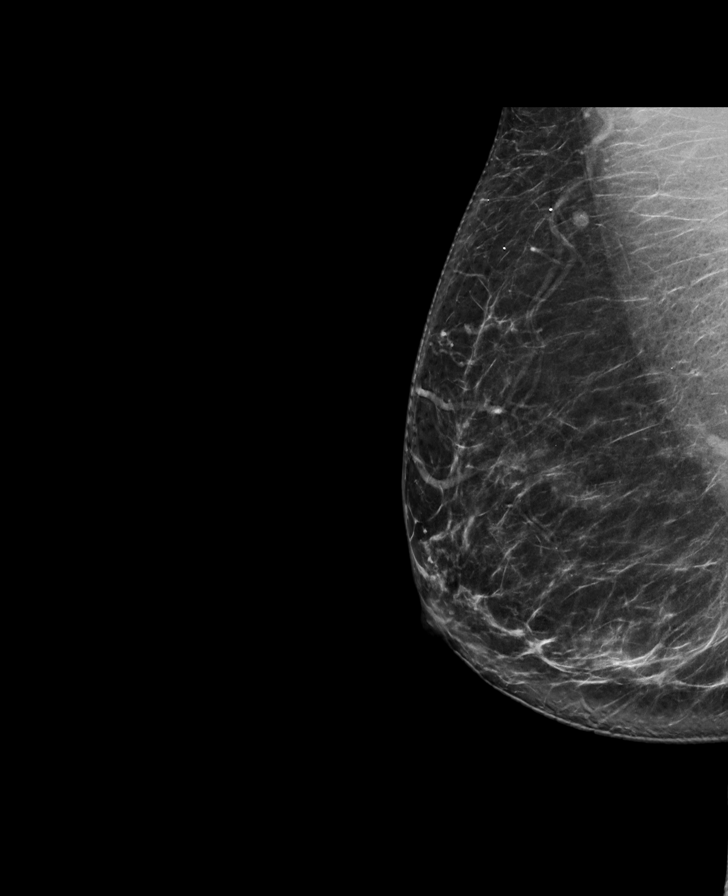

[L MLO synth-2D]
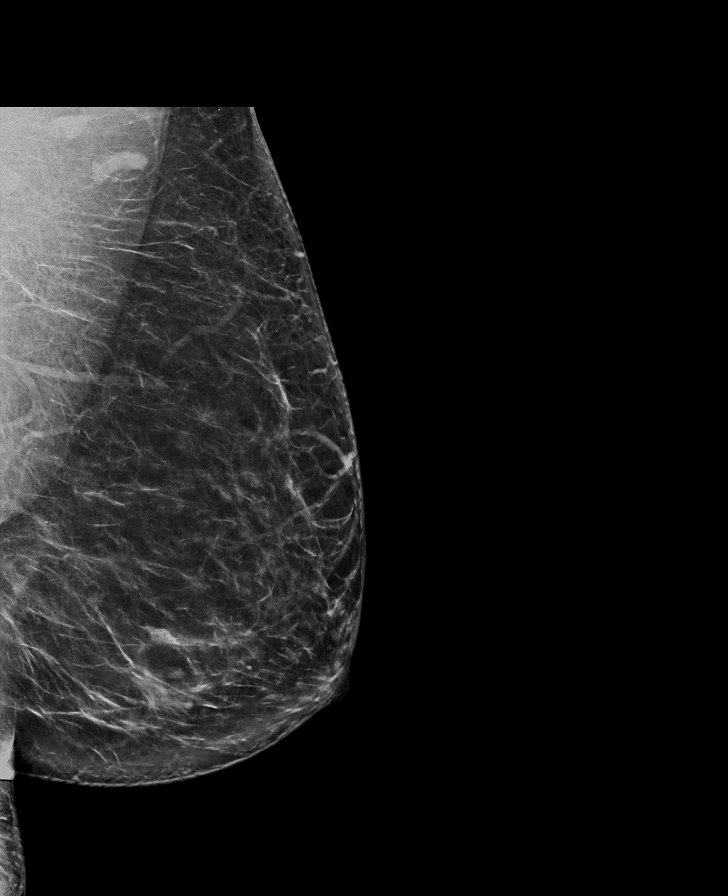

[R CC synth-2D]
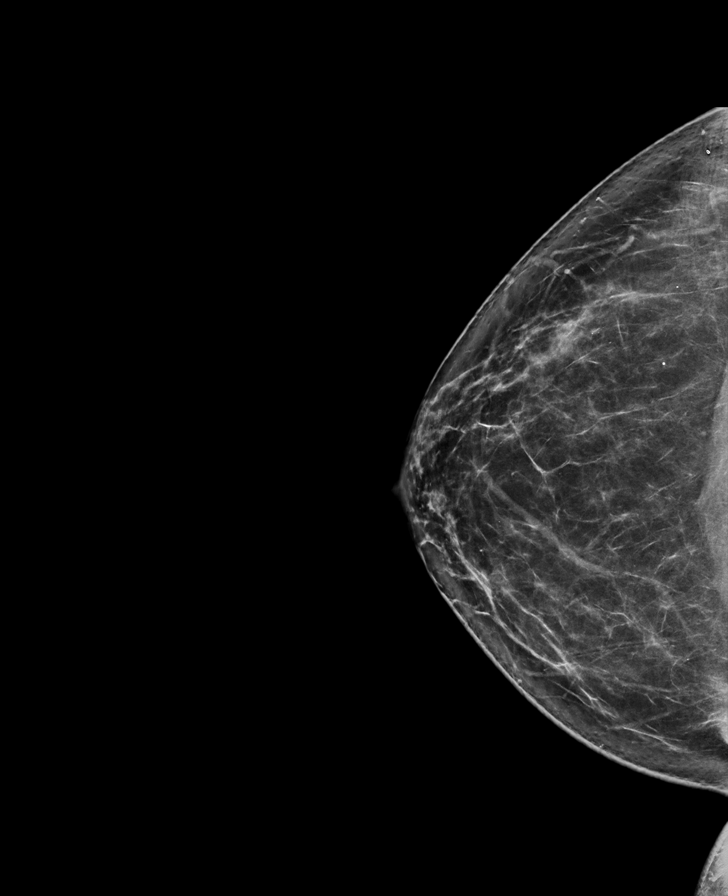

[L CC synth-2D]
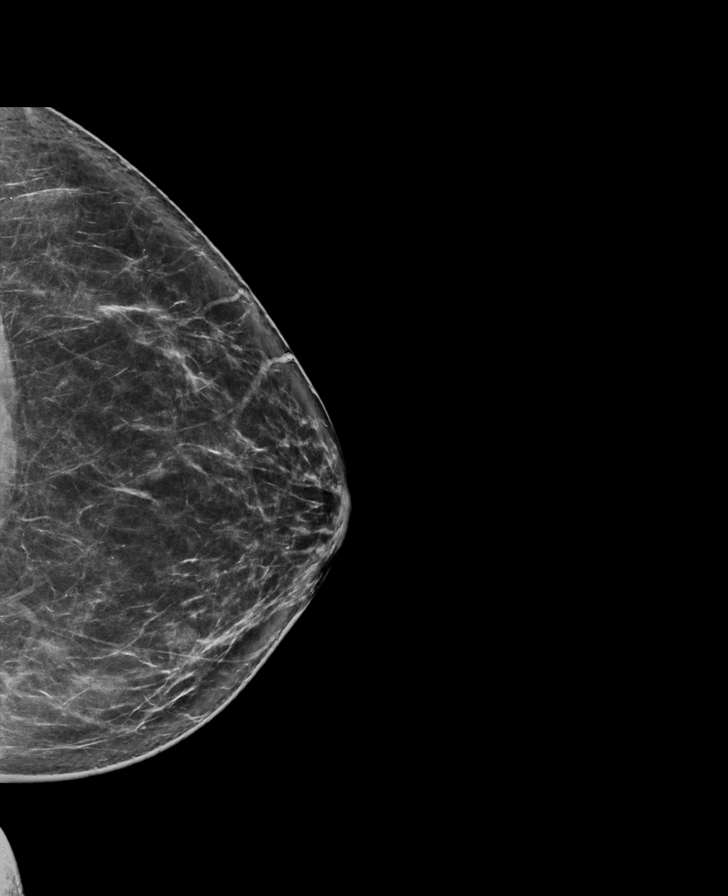

[L MLO tomo · tomo slice 45/90.0]
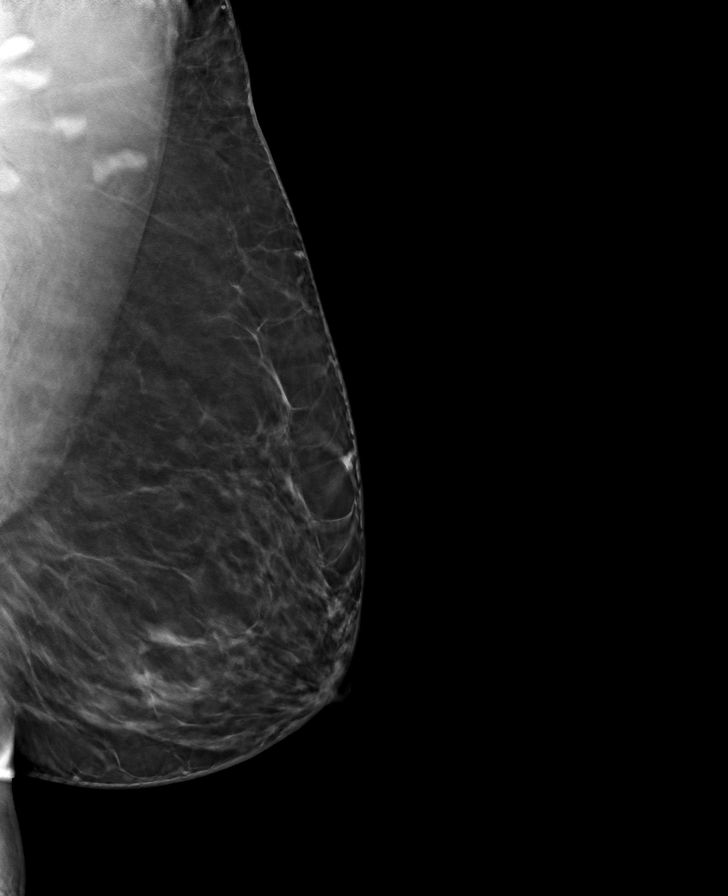

[R MLO tomo · tomo slice 43/85.0]
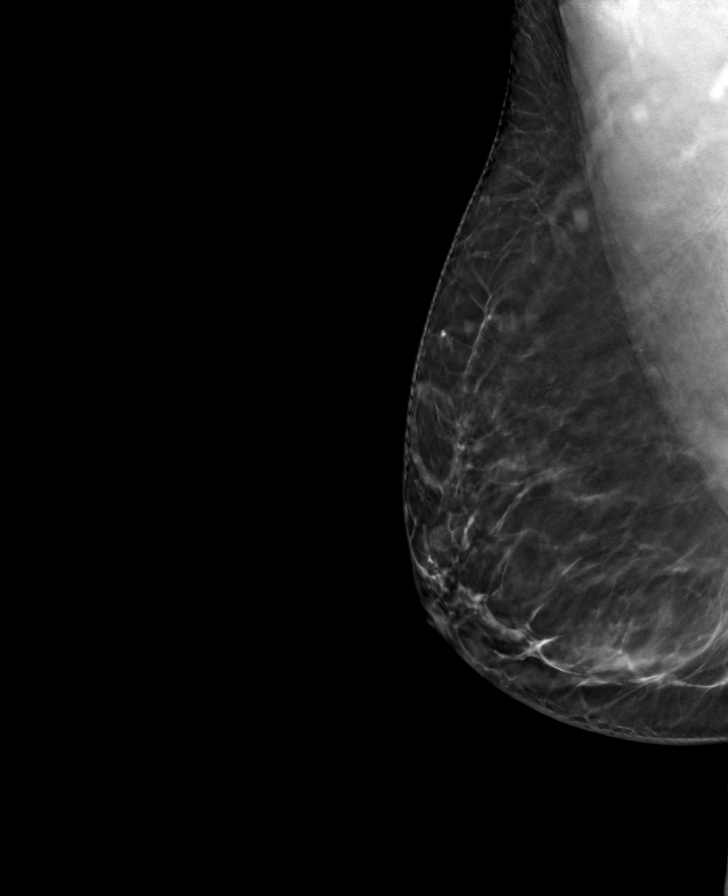

[R CC tomo · tomo slice 41/82.0]
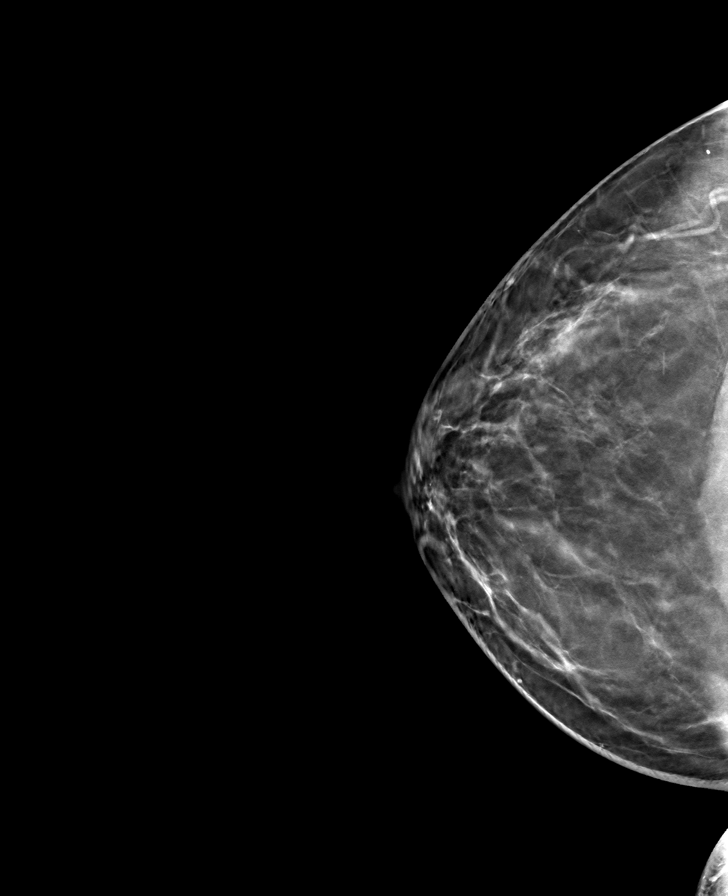

[L CC tomo · tomo slice 41/81.0]
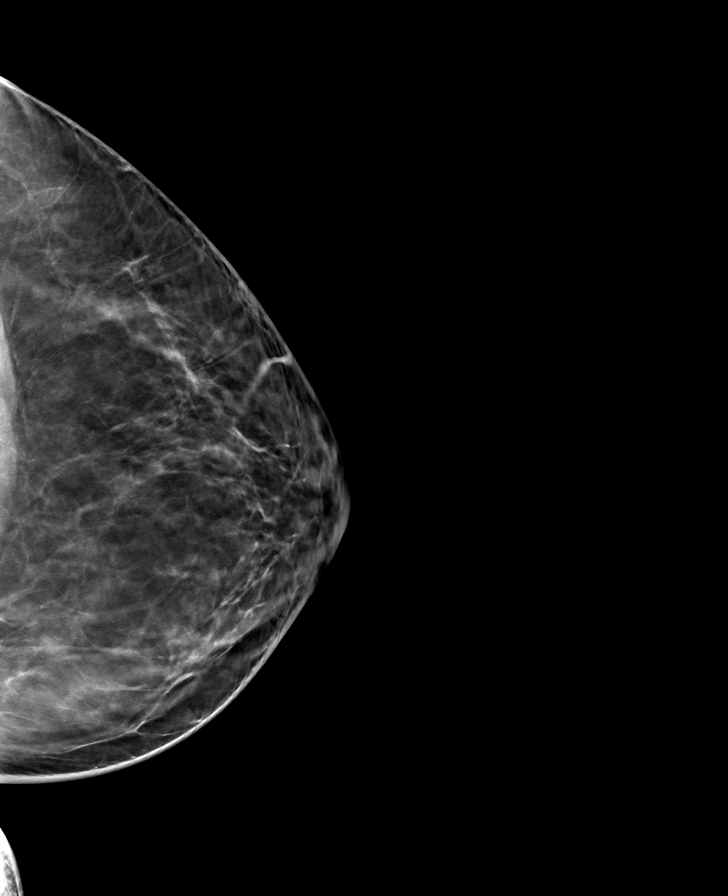

[8 of 24 positions shown; findings below may reference images not displayed]

ACR Breast Density Category b: There are scattered areas of
fibroglandular density.
FINDINGS: The previously demonstrated LEFT breast asymmetry is no longer seen,
confirming benignity, presumably resolved cyst. There are no new
dominant masses, suspicious calcifications or secondary signs of
malignancy within either breast.

Mammographic images were processed with CAD.
IMPRESSION: No evidence of malignancy within either breast.

Patient may return to routine annual bilateral screening mammogram
schedule.

RECOMMENDATION:
Screening mammogram in one year.(Code:17-M-VEC)

I have discussed the findings and recommendations with the patient.
If applicable, a reminder letter will be sent to the patient
regarding the next appointment.

BI-RADS CATEGORY  1: Negative.

## 2020-11-02 ENCOUNTER — Encounter: Payer: BC Managed Care – PPO | Admitting: Internal Medicine

## 2020-11-28 ENCOUNTER — Encounter: Payer: BC Managed Care – PPO | Admitting: Internal Medicine

## 2020-12-14 ENCOUNTER — Encounter: Payer: BC Managed Care – PPO | Admitting: Internal Medicine

## 2021-01-25 ENCOUNTER — Ambulatory Visit (INDEPENDENT_AMBULATORY_CARE_PROVIDER_SITE_OTHER): Payer: BC Managed Care – PPO | Admitting: Internal Medicine

## 2021-01-25 ENCOUNTER — Encounter: Payer: Self-pay | Admitting: Internal Medicine

## 2021-01-25 ENCOUNTER — Other Ambulatory Visit: Payer: Self-pay

## 2021-01-25 VITALS — BP 112/78 | HR 73 | Temp 98.2°F | Ht 62.0 in | Wt 181.0 lb

## 2021-01-25 DIAGNOSIS — K30 Functional dyspepsia: Secondary | ICD-10-CM | POA: Diagnosis not present

## 2021-01-25 DIAGNOSIS — R202 Paresthesia of skin: Secondary | ICD-10-CM

## 2021-01-25 DIAGNOSIS — Z0000 Encounter for general adult medical examination without abnormal findings: Secondary | ICD-10-CM

## 2021-01-25 LAB — COMPREHENSIVE METABOLIC PANEL
ALT: 20 U/L (ref 0–35)
AST: 19 U/L (ref 0–37)
Albumin: 4.3 g/dL (ref 3.5–5.2)
Alkaline Phosphatase: 67 U/L (ref 39–117)
BUN: 15 mg/dL (ref 6–23)
CO2: 31 mEq/L (ref 19–32)
Calcium: 10.1 mg/dL (ref 8.4–10.5)
Chloride: 103 mEq/L (ref 96–112)
Creatinine, Ser: 1.22 mg/dL — ABNORMAL HIGH (ref 0.40–1.20)
GFR: 50.7 mL/min — ABNORMAL LOW (ref >60.00)
Glucose, Bld: 87 mg/dL (ref 70–99)
Potassium: 4.3 mEq/L (ref 3.5–5.1)
Sodium: 138 mEq/L (ref 135–145)
Total Bilirubin: 0.3 mg/dL (ref 0.2–1.2)
Total Protein: 7.5 g/dL (ref 6.0–8.3)

## 2021-01-25 LAB — URINALYSIS
Bilirubin Urine: NEGATIVE
Hgb urine dipstick: NEGATIVE
Ketones, ur: NEGATIVE
Leukocytes,Ua: NEGATIVE
Nitrite: NEGATIVE
Specific Gravity, Urine: 1.01 (ref 1.000–1.030)
Total Protein, Urine: NEGATIVE
Urine Glucose: NEGATIVE
Urobilinogen, UA: 0.2 (ref 0.0–1.0)
pH: 6 (ref 5.0–8.0)

## 2021-01-25 LAB — CBC WITH DIFFERENTIAL/PLATELET
Basophils Absolute: 0.1 10*3/uL (ref 0.0–0.1)
Basophils Relative: 0.7 % (ref 0.0–3.0)
Eosinophils Absolute: 0.1 10*3/uL (ref 0.0–0.7)
Eosinophils Relative: 0.8 % (ref 0.0–5.0)
HCT: 39.1 % (ref 36.0–46.0)
Hemoglobin: 12.8 g/dL (ref 12.0–15.0)
Lymphocytes Relative: 45.6 % (ref 12.0–46.0)
Lymphs Abs: 3.2 10*3/uL (ref 0.7–4.0)
MCHC: 32.8 g/dL (ref 30.0–36.0)
MCV: 84.9 fl (ref 78.0–100.0)
Monocytes Absolute: 0.4 10*3/uL (ref 0.1–1.0)
Monocytes Relative: 5.6 % (ref 3.0–12.0)
Neutro Abs: 3.3 10*3/uL (ref 1.4–7.7)
Neutrophils Relative %: 47.3 % (ref 43.0–77.0)
Platelets: 382 10*3/uL (ref 150.0–400.0)
RBC: 4.61 Mil/uL (ref 3.87–5.11)
RDW: 13.3 % (ref 11.5–15.5)
WBC: 7.1 10*3/uL (ref 4.0–10.5)

## 2021-01-25 LAB — VITAMIN B12: Vitamin B-12: 625 pg/mL (ref 211–911)

## 2021-01-25 LAB — LIPID PANEL
Cholesterol: 143 mg/dL (ref 0–200)
HDL: 53.6 mg/dL (ref >39.00)
LDL Cholesterol: 76 mg/dL (ref 0–99)
NonHDL: 89.43
Total CHOL/HDL Ratio: 3
Triglycerides: 65 mg/dL (ref 0.0–149.0)
VLDL: 13 mg/dL (ref 0.0–40.0)

## 2021-01-25 LAB — TSH: TSH: 2.19 u[IU]/mL (ref 0.35–4.50)

## 2021-01-25 LAB — H. PYLORI ANTIBODY, IGG: H Pylori IgG: NEGATIVE

## 2021-01-25 MED ORDER — FAMOTIDINE 20 MG PO TABS: 20.0000 mg | ORAL_TABLET | Freq: Two times a day (BID) | ORAL | 0 refills | Status: DC

## 2021-01-25 NOTE — Progress Notes (Signed)
Subjective:  Patient ID: Belinda Johnson, female    DOB: 07-12-1966  Age: 55 y.o. MRN: [id number]  CC: Annual Exam   HPI Ariyana Faw presents for a well exam C/o throat congestion C/o cold feet  Outpatient Medications Prior to Visit  Medication Sig Dispense Refill  . acetaminophen (TYLENOL) 650 MG CR tablet Take 650 mg by mouth every 8 (eight) hours as needed for pain.    . Calcium Carbonate (CALCIUM 600 PO) Take by mouth daily.    . cholecalciferol (VITAMIN D) 1000 UNITS tablet Take 1,000 Units by mouth daily.    . cyclobenzaprine (FLEXERIL) 5 MG tablet Take 1 tablet (5 mg total) by mouth 2 (two) times daily as needed for muscle spasms. (Patient not taking: No sig reported) 60 tablet 0  . meloxicam (MOBIC) 15 MG tablet Take 1 tablet (15 mg total) by mouth daily. (Patient not taking: No sig reported) 30 tablet 0  . Multiple Vitamin (MULTIVITAMIN) tablet Take 1 tablet by mouth daily. (Patient not taking: Reported on 01/25/2021)    . Omega-3 Fatty Acids (FISH OIL CONCENTRATE PO) Take by mouth. (Patient not taking: Reported on 01/25/2021)     No facility-administered medications prior to visit.    ROS: Review of Systems  Constitutional: Negative for activity change, appetite change, chills, fatigue and unexpected weight change.  HENT: Positive for sore throat and voice change. Negative for congestion, mouth sores and sinus pressure.   Eyes: Negative for visual disturbance.  Respiratory: Negative for cough and chest tightness.   Gastrointestinal: Negative for abdominal pain and nausea.  Genitourinary: Negative for difficulty urinating, frequency and vaginal pain.  Musculoskeletal: Negative for back pain and gait problem.  Skin: Negative for pallor and rash.  Neurological: Negative for dizziness, tremors, weakness, numbness and headaches.  Psychiatric/Behavioral: Negative for confusion and sleep disturbance.    Objective:  BP 112/78 (BP Location: Left Arm)   Pulse 73    Temp 98.2 F (36.8 C) (Oral)   Ht 5\' 2"  (1.575 m)   Wt 181 lb (82.1 kg)   SpO2 97%   BMI 33.11 kg/m   BP Readings from Last 3 Encounters:  01/25/21 112/78  10/18/19 106/76  08/31/19 120/80    Wt Readings from Last 3 Encounters:  01/25/21 181 lb (82.1 kg)  10/18/19 193 lb (87.5 kg)  09/21/19 193 lb (87.5 kg)    Physical Exam Constitutional:      General: She is not in acute distress.    Appearance: She is well-developed. She is obese.  HENT:     Head: Normocephalic.     Right Ear: External ear normal.     Left Ear: External ear normal.     Nose: Nose normal.     Mouth/Throat:     Mouth: Oropharynx is clear and moist.  Eyes:     General:        Right eye: No discharge.        Left eye: No discharge.     Conjunctiva/sclera: Conjunctivae normal.     Pupils: Pupils are equal, round, and reactive to light.  Neck:     Thyroid: No thyromegaly.     Vascular: No JVD.     Trachea: No tracheal deviation.  Cardiovascular:     Rate and Rhythm: Normal rate and regular rhythm.     Heart sounds: Normal heart sounds.  Pulmonary:     Effort: No respiratory distress.     Breath sounds: No stridor. No wheezing.  Abdominal:     General: Bowel sounds are normal. There is no distension.     Palpations: Abdomen is soft. There is no mass.     Tenderness: There is no abdominal tenderness. There is no guarding or rebound.  Musculoskeletal:        General: No tenderness or edema.     Cervical back: Normal range of motion and neck supple.  Lymphadenopathy:     Cervical: No cervical adenopathy.  Skin:    Findings: No erythema or rash.  Neurological:     Cranial Nerves: No cranial nerve deficit.     Motor: No abnormal muscle tone.     Coordination: Coordination normal.     Deep Tendon Reflexes: Reflexes normal.  Psychiatric:        Mood and Affect: Mood and affect normal.        Behavior: Behavior normal.        Thought Content: Thought content normal.        Judgment: Judgment  normal.     Lab Results  Component Value Date   WBC 6.2 08/31/2019   HGB 13.6 08/31/2019   HCT 40.6 08/31/2019   PLT 243.0 08/31/2019   GLUCOSE 101 (H) 08/31/2019   CHOL 143 08/31/2019   TRIG 88.0 08/31/2019   HDL 54.60 08/31/2019   LDLCALC 71 08/31/2019   ALT 32 08/31/2019   AST 26 08/31/2019   NA 140 08/31/2019   K 4.2 08/31/2019   CL 104 08/31/2019   CREATININE 1.38 (H) 08/31/2019   BUN 16 08/31/2019   CO2 27 08/31/2019   TSH 3.68 08/31/2019    MM DIAG BREAST TOMO BILATERAL  Result Date: 09/19/2020 CLINICAL DATA:  Follow-up for probably benign LEFT breast asymmetry. EXAM: DIGITAL DIAGNOSTIC BILATERAL MAMMOGRAM WITH TOMO AND CAD COMPARISON:  Previous exams including most diagnostic mammograms dated 07/24/2020 and 09/17/2019 and screening mammogram dated 09/09/2019. ACR Breast Density Category b: There are scattered areas of fibroglandular density. FINDINGS: The previously demonstrated LEFT breast asymmetry is no longer seen, confirming benignity, presumably resolved cyst. There are no new dominant masses, suspicious calcifications or secondary signs of malignancy within either breast. Mammographic images were processed with CAD. IMPRESSION: No evidence of malignancy within either breast. Patient may return to routine annual bilateral screening mammogram schedule. RECOMMENDATION: Screening mammogram in one year.(Code:SM-B-01Y) I have discussed the findings and recommendations with the patient. If applicable, a reminder letter will be sent to the patient regarding the next appointment. BI-RADS CATEGORY  1: Negative. Electronically Signed   By: Franki Cabot M.D.   On: 09/19/2020 14:09    Assessment & Plan:   Walker Kehr, MD

## 2021-01-29 ENCOUNTER — Encounter: Payer: Self-pay | Admitting: Internal Medicine

## 2021-01-29 DIAGNOSIS — K30 Functional dyspepsia: Secondary | ICD-10-CM | POA: Insufficient documentation

## 2021-01-29 NOTE — Assessment & Plan Note (Signed)
  We discussed age appropriate health related issues, including available/recomended screening tests and vaccinations. Labs were ordered to be later reviewed . All questions were answered. We discussed one or more of the following - seat belt use, use of sunscreen/sun exposure exercise, safe sex, fall risk reduction, second hand smoke exposure, firearm use and storage, seat belt use, a need for adhering to healthy diet and exercise. Labs were ordered.  All questions were answered. Exercising regularly Colonoscopy 2020 PAP w/GYN Irreg periods and hot flashes lately

## 2021-01-29 NOTE — Assessment & Plan Note (Signed)
Start Pepcid H. pylori test

## 2022-10-24 ENCOUNTER — Encounter: Payer: Self-pay | Admitting: Internal Medicine

## 2022-10-24 ENCOUNTER — Ambulatory Visit (INDEPENDENT_AMBULATORY_CARE_PROVIDER_SITE_OTHER): Payer: BC Managed Care – PPO | Admitting: Internal Medicine

## 2022-10-24 VITALS — BP 98/62 | HR 86 | Temp 98.1°F | Ht 62.0 in | Wt 190.0 lb

## 2022-10-24 DIAGNOSIS — R5383 Other fatigue: Secondary | ICD-10-CM | POA: Diagnosis not present

## 2022-10-24 DIAGNOSIS — L659 Nonscarring hair loss, unspecified: Secondary | ICD-10-CM | POA: Diagnosis not present

## 2022-10-24 DIAGNOSIS — R202 Paresthesia of skin: Secondary | ICD-10-CM

## 2022-10-24 DIAGNOSIS — Z0001 Encounter for general adult medical examination with abnormal findings: Secondary | ICD-10-CM | POA: Diagnosis not present

## 2022-10-24 DIAGNOSIS — Z0000 Encounter for general adult medical examination without abnormal findings: Secondary | ICD-10-CM

## 2022-10-24 NOTE — Patient Instructions (Signed)
Rogaine for Women  Emu oil for hair

## 2022-10-24 NOTE — Progress Notes (Signed)
Subjective:  Patient ID: Belinda Johnson, female    DOB: May 12, 1966  Age: 56 y.o. MRN: [id number]  CC: Annual Exam   HPI Belinda Johnson presents for a well exam.  The patient is complaining of hair loss.  Outpatient Medications Prior to Visit  Medication Sig Dispense Refill   acetaminophen (TYLENOL) 650 MG CR tablet Take 650 mg by mouth every 8 (eight) hours as needed for pain.     Calcium Carbonate (CALCIUM 600 PO) Take by mouth daily.     cholecalciferol (VITAMIN D) 1000 UNITS tablet Take 1,000 Units by mouth daily.     famotidine (PEPCID) 20 MG tablet Take 1 tablet (20 mg total) by mouth 2 (two) times daily. (Patient not taking: Reported on 10/24/2022) 30 tablet 0   No facility-administered medications prior to visit.    ROS: Review of Systems  Constitutional:  Negative for activity change, appetite change, chills, fatigue and unexpected weight change.  HENT:  Negative for congestion, mouth sores and sinus pressure.   Eyes:  Negative for visual disturbance.  Respiratory:  Negative for cough and chest tightness.   Gastrointestinal:  Negative for abdominal pain and nausea.  Genitourinary:  Negative for difficulty urinating, frequency and vaginal pain.  Musculoskeletal:  Negative for back pain and gait problem.  Skin:  Negative for pallor and rash.  Neurological:  Negative for dizziness, tremors, weakness, numbness and headaches.  Psychiatric/Behavioral:  Negative for confusion and sleep disturbance.     Objective:  BP 98/62 (BP Location: Left Arm)   Pulse 86   Temp 98.1 F (36.7 C) (Oral)   Ht '5\' 2"'$  (1.575 m)   Wt 190 lb (86.2 kg)   SpO2 96%   BMI 34.75 kg/m   BP Readings from Last 3 Encounters:  10/24/22 98/62  01/25/21 112/78  10/18/19 106/76    Wt Readings from Last 3 Encounters:  10/24/22 190 lb (86.2 kg)  01/25/21 181 lb (82.1 kg)  10/18/19 193 lb (87.5 kg)    Physical Exam Constitutional:      General: She is not in acute distress.     Appearance: She is well-developed.  HENT:     Head: Normocephalic.     Right Ear: External ear normal.     Left Ear: External ear normal.     Nose: Nose normal.  Eyes:     General:        Right eye: No discharge.        Left eye: No discharge.     Conjunctiva/sclera: Conjunctivae normal.     Pupils: Pupils are equal, round, and reactive to light.  Neck:     Thyroid: No thyromegaly.     Vascular: No JVD.     Trachea: No tracheal deviation.  Cardiovascular:     Rate and Rhythm: Normal rate and regular rhythm.     Heart sounds: Normal heart sounds.  Pulmonary:     Effort: No respiratory distress.     Breath sounds: No stridor. No wheezing.  Abdominal:     General: Bowel sounds are normal. There is no distension.     Palpations: Abdomen is soft. There is no mass.     Tenderness: There is no abdominal tenderness. There is no guarding or rebound.  Musculoskeletal:        General: No tenderness.     Cervical back: Normal range of motion and neck supple. No rigidity.  Lymphadenopathy:     Cervical: No cervical adenopathy.  Skin:  Findings: No erythema or rash.  Neurological:     Cranial Nerves: No cranial nerve deficit.     Motor: No abnormal muscle tone.     Coordination: Coordination normal.     Deep Tendon Reflexes: Reflexes normal.  Psychiatric:        Behavior: Behavior normal.        Thought Content: Thought content normal.        Judgment: Judgment normal.     Lab Results  Component Value Date   WBC 7.1 01/25/2021   HGB 12.8 01/25/2021   HCT 39.1 01/25/2021   PLT 382.0 01/25/2021   GLUCOSE 87 01/25/2021   CHOL 143 01/25/2021   TRIG 65.0 01/25/2021   HDL 53.60 01/25/2021   LDLCALC 76 01/25/2021   ALT 20 01/25/2021   AST 19 01/25/2021   NA 138 01/25/2021   K 4.3 01/25/2021   CL 103 01/25/2021   CREATININE 1.22 (H) 01/25/2021   BUN 15 01/25/2021   CO2 31 01/25/2021   TSH 2.19 01/25/2021    MM DIAG BREAST TOMO BILATERAL  Result Date:  09/19/2020 CLINICAL DATA:  Follow-up for probably benign LEFT breast asymmetry. EXAM: DIGITAL DIAGNOSTIC BILATERAL MAMMOGRAM WITH TOMO AND CAD COMPARISON:  Previous exams including most diagnostic mammograms dated 07/24/2020 and 09/17/2019 and screening mammogram dated 09/09/2019. ACR Breast Density Category b: There are scattered areas of fibroglandular density. FINDINGS: The previously demonstrated LEFT breast asymmetry is no longer seen, confirming benignity, presumably resolved cyst. There are no new dominant masses, suspicious calcifications or secondary signs of malignancy within either breast. Mammographic images were processed with CAD. IMPRESSION: No evidence of malignancy within either breast. Patient may return to routine annual bilateral screening mammogram schedule. RECOMMENDATION: Screening mammogram in one year.(Code:SM-B-01Y) I have discussed the findings and recommendations with the patient. If applicable, a reminder letter will be sent to the patient regarding the next appointment. BI-RADS CATEGORY  1: Negative. Electronically Signed   By: Franki Cabot M.D.   On: 09/19/2020 14:09    Assessment & Plan:   Problem List Items Addressed This Visit     Fatigue   Paresthesia   Relevant Orders   Vitamin B12   Well adult exam - Primary     We discussed age appropriate health related issues, including available/recomended screening tests and vaccinations. Labs were ordered to be later reviewed . All questions were answered. We discussed one or more of the following - seat belt use, use of sunscreen/sun exposure exercise, safe sex, fall risk reduction, second hand smoke exposure, firearm use and storage, seat belt use, a need for adhering to healthy diet and exercise. Labs were ordered.  All questions were answered. Exercising regularly Last colonoscopy 2020 PAP w/GYN Eye exam yearly Irreg periods and hot flashes lately      Relevant Orders   TSH   Urinalysis   CBC with  Differential/Platelet   Lipid panel   Comprehensive metabolic panel   T4, free   Vitamin B12   VITAMIN D 25 Hydroxy (Vit-D Deficiency, Fractures)   Other Visit Diagnoses     Hair loss       Relevant Orders   T4, free   Vitamin B12   VITAMIN D 25 Hydroxy (Vit-D Deficiency, Fractures)         No orders of the defined types were placed in this encounter.     Follow-up: Return in about 1 year (around 10/25/2023) for Wellness Exam.  Walker Kehr, MD

## 2022-10-27 NOTE — Assessment & Plan Note (Signed)
  We discussed age appropriate health related issues, including available/recomended screening tests and vaccinations. Labs were ordered to be later reviewed . All questions were answered. We discussed one or more of the following - seat belt use, use of sunscreen/sun exposure exercise, safe sex, fall risk reduction, second hand smoke exposure, firearm use and storage, seat belt use, a need for adhering to healthy diet and exercise. Labs were ordered.  All questions were answered. Exercising regularly Last colonoscopy 2020 PAP w/GYN Eye exam yearly Irreg periods and hot flashes lately

## 2022-11-29 ENCOUNTER — Other Ambulatory Visit (INDEPENDENT_AMBULATORY_CARE_PROVIDER_SITE_OTHER): Payer: BC Managed Care – PPO

## 2022-11-29 DIAGNOSIS — L659 Nonscarring hair loss, unspecified: Secondary | ICD-10-CM

## 2022-11-29 DIAGNOSIS — Z0000 Encounter for general adult medical examination without abnormal findings: Secondary | ICD-10-CM | POA: Diagnosis not present

## 2022-11-29 DIAGNOSIS — R202 Paresthesia of skin: Secondary | ICD-10-CM

## 2022-11-29 LAB — URINALYSIS
Bilirubin Urine: NEGATIVE
Hgb urine dipstick: NEGATIVE
Ketones, ur: NEGATIVE
Leukocytes,Ua: NEGATIVE
Nitrite: NEGATIVE
Specific Gravity, Urine: <=1.005 — AB (ref 1.000–1.030)
Total Protein, Urine: NEGATIVE
Urine Glucose: NEGATIVE
Urobilinogen, UA: 0.2 (ref 0.0–1.0)
pH: 6 (ref 5.0–8.0)

## 2022-11-29 LAB — CBC WITH DIFFERENTIAL/PLATELET
Basophils Absolute: 0 10*3/uL (ref 0.0–0.1)
Basophils Relative: 0.7 % (ref 0.0–3.0)
Eosinophils Absolute: 0.1 10*3/uL (ref 0.0–0.7)
Eosinophils Relative: 1.3 % (ref 0.0–5.0)
HCT: 40.4 % (ref 36.0–46.0)
Hemoglobin: 13.4 g/dL (ref 12.0–15.0)
Lymphocytes Relative: 52.8 % — ABNORMAL HIGH (ref 12.0–46.0)
Lymphs Abs: 3.1 10*3/uL (ref 0.7–4.0)
MCHC: 33.2 g/dL (ref 30.0–36.0)
MCV: 85.9 fl (ref 78.0–100.0)
Monocytes Absolute: 0.4 10*3/uL (ref 0.1–1.0)
Monocytes Relative: 6.4 % (ref 3.0–12.0)
Neutro Abs: 2.3 10*3/uL (ref 1.4–7.7)
Neutrophils Relative %: 38.8 % — ABNORMAL LOW (ref 43.0–77.0)
Platelets: 258 10*3/uL (ref 150.0–400.0)
RBC: 4.71 Mil/uL (ref 3.87–5.11)
RDW: 14 % (ref 11.5–15.5)
WBC: 5.9 10*3/uL (ref 4.0–10.5)

## 2022-11-29 LAB — COMPREHENSIVE METABOLIC PANEL
ALT: 20 U/L (ref 0–35)
AST: 21 U/L (ref 0–37)
Albumin: 4.3 g/dL (ref 3.5–5.2)
Alkaline Phosphatase: 65 U/L (ref 39–117)
BUN: 18 mg/dL (ref 6–23)
CO2: 30 mEq/L (ref 19–32)
Calcium: 9.6 mg/dL (ref 8.4–10.5)
Chloride: 103 mEq/L (ref 96–112)
Creatinine, Ser: 1.23 mg/dL — ABNORMAL HIGH (ref 0.40–1.20)
GFR: 49.56 mL/min — ABNORMAL LOW (ref >60.00)
Glucose, Bld: 93 mg/dL (ref 70–99)
Potassium: 4.4 mEq/L (ref 3.5–5.1)
Sodium: 139 mEq/L (ref 135–145)
Total Bilirubin: 0.3 mg/dL (ref 0.2–1.2)
Total Protein: 6.8 g/dL (ref 6.0–8.3)

## 2022-11-29 LAB — LIPID PANEL
Cholesterol: 159 mg/dL (ref 0–200)
HDL: 68.2 mg/dL (ref >39.00)
LDL Cholesterol: 77 mg/dL (ref 0–99)
NonHDL: 90.96
Total CHOL/HDL Ratio: 2
Triglycerides: 69 mg/dL (ref 0.0–149.0)
VLDL: 13.8 mg/dL (ref 0.0–40.0)

## 2022-11-29 LAB — VITAMIN B12: Vitamin B-12: 696 pg/mL (ref 211–911)

## 2022-11-29 LAB — VITAMIN D 25 HYDROXY (VIT D DEFICIENCY, FRACTURES): VITD: 42.04 ng/mL (ref 30.00–100.00)

## 2022-11-29 LAB — TSH: TSH: 2.88 u[IU]/mL (ref 0.35–5.50)

## 2022-11-29 LAB — T4, FREE: Free T4: 0.74 ng/dL (ref 0.60–1.60)

## 2022-12-11 ENCOUNTER — Ambulatory Visit: Payer: BC Managed Care – PPO

## 2024-07-12 ENCOUNTER — Telehealth: Payer: Self-pay | Admitting: Internal Medicine

## 2024-07-12 DIAGNOSIS — Z0000 Encounter for general adult medical examination without abnormal findings: Secondary | ICD-10-CM

## 2024-07-12 NOTE — Telephone Encounter (Signed)
 Patient is scheduled for labs tomorrow 07/13/24 prior to her appointment 07/14/24 for her physical. Can orders please be placed for her to get these done?

## 2024-07-12 NOTE — Addendum Note (Signed)
 Addended by: HEDDY IP R on: 07/12/2024 03:37 PM   Modules accepted: Orders

## 2024-07-12 NOTE — Telephone Encounter (Signed)
 Labs have been ordered for pts appointment tomorrow with the lab.

## 2024-07-13 ENCOUNTER — Other Ambulatory Visit (INDEPENDENT_AMBULATORY_CARE_PROVIDER_SITE_OTHER): Payer: Self-pay

## 2024-07-13 DIAGNOSIS — Z0000 Encounter for general adult medical examination without abnormal findings: Secondary | ICD-10-CM | POA: Diagnosis not present

## 2024-07-13 LAB — CBC WITH DIFFERENTIAL/PLATELET
Basophils Absolute: 0 K/uL (ref 0.0–0.1)
Basophils Relative: 1 % (ref 0.0–3.0)
Eosinophils Absolute: 0.1 K/uL (ref 0.0–0.7)
Eosinophils Relative: 2.7 % (ref 0.0–5.0)
HCT: 39.8 % (ref 36.0–46.0)
Hemoglobin: 13.1 g/dL (ref 12.0–15.0)
Lymphocytes Relative: 50 % — ABNORMAL HIGH (ref 12.0–46.0)
Lymphs Abs: 2.5 K/uL (ref 0.7–4.0)
MCHC: 32.8 g/dL (ref 30.0–36.0)
MCV: 85.4 fl (ref 78.0–100.0)
Monocytes Absolute: 0.3 K/uL (ref 0.1–1.0)
Monocytes Relative: 5.4 % (ref 3.0–12.0)
Neutro Abs: 2 K/uL (ref 1.4–7.7)
Neutrophils Relative %: 40.9 % — ABNORMAL LOW (ref 43.0–77.0)
Platelets: 244 K/uL (ref 150.0–400.0)
RBC: 4.66 Mil/uL (ref 3.87–5.11)
RDW: 13.8 % (ref 11.5–15.5)
WBC: 5 K/uL (ref 4.0–10.5)

## 2024-07-13 LAB — URINALYSIS, ROUTINE W REFLEX MICROSCOPIC
Bilirubin Urine: NEGATIVE
Hgb urine dipstick: NEGATIVE
Ketones, ur: NEGATIVE
Leukocytes,Ua: NEGATIVE
Nitrite: NEGATIVE
RBC / HPF: NONE SEEN (ref 0–?)
Specific Gravity, Urine: <=1.005 — AB (ref 1.000–1.030)
Total Protein, Urine: NEGATIVE
Urine Glucose: NEGATIVE
Urobilinogen, UA: 0.2 (ref 0.0–1.0)
WBC, UA: NONE SEEN (ref 0–?)
pH: 6 (ref 5.0–8.0)

## 2024-07-13 LAB — COMPREHENSIVE METABOLIC PANEL WITH GFR
ALT: 29 U/L (ref 0–35)
AST: 31 U/L (ref 0–37)
Albumin: 4.3 g/dL (ref 3.5–5.2)
Alkaline Phosphatase: 59 U/L (ref 39–117)
BUN: 12 mg/dL (ref 6–23)
CO2: 29 meq/L (ref 19–32)
Calcium: 9.6 mg/dL (ref 8.4–10.5)
Chloride: 104 meq/L (ref 96–112)
Creatinine, Ser: 1.26 mg/dL — ABNORMAL HIGH (ref 0.40–1.20)
GFR: 47.61 mL/min — ABNORMAL LOW (ref >60.00)
Glucose, Bld: 111 mg/dL — ABNORMAL HIGH (ref 70–99)
Potassium: 4.4 meq/L (ref 3.5–5.1)
Sodium: 139 meq/L (ref 135–145)
Total Bilirubin: 0.3 mg/dL (ref 0.2–1.2)
Total Protein: 6.6 g/dL (ref 6.0–8.3)

## 2024-07-13 LAB — T4, FREE: Free T4: 0.83 ng/dL (ref 0.60–1.60)

## 2024-07-13 LAB — TSH: TSH: 2.42 u[IU]/mL (ref 0.35–5.50)

## 2024-07-13 LAB — LIPID PANEL
Cholesterol: 159 mg/dL (ref 0–200)
HDL: 62.2 mg/dL (ref >39.00)
LDL Cholesterol: 84 mg/dL (ref 0–99)
NonHDL: 96.87
Total CHOL/HDL Ratio: 3
Triglycerides: 62 mg/dL (ref 0.0–149.0)
VLDL: 12.4 mg/dL (ref 0.0–40.0)

## 2024-07-13 LAB — VITAMIN D 25 HYDROXY (VIT D DEFICIENCY, FRACTURES): VITD: 42.3 ng/mL (ref 30.00–100.00)

## 2024-07-13 LAB — VITAMIN B12: Vitamin B-12: 635 pg/mL (ref 211–911)

## 2024-07-14 ENCOUNTER — Ambulatory Visit: Payer: Self-pay | Admitting: Internal Medicine

## 2024-07-14 ENCOUNTER — Encounter: Payer: Self-pay | Admitting: Internal Medicine

## 2024-07-14 VITALS — BP 118/78 | HR 68 | Temp 98.6°F | Ht 62.0 in | Wt 189.0 lb

## 2024-07-14 DIAGNOSIS — L659 Nonscarring hair loss, unspecified: Secondary | ICD-10-CM | POA: Diagnosis not present

## 2024-07-14 DIAGNOSIS — R0683 Snoring: Secondary | ICD-10-CM | POA: Diagnosis not present

## 2024-07-14 DIAGNOSIS — Z0000 Encounter for general adult medical examination without abnormal findings: Secondary | ICD-10-CM

## 2024-07-14 NOTE — Patient Instructions (Addendum)
 Chin strap Rogaine for women

## 2024-07-14 NOTE — Progress Notes (Signed)
 Subjective:  Patient ID: Belinda Johnson, female    DOB: 03-Jul-1966  Age: 58 y.o. MRN: [id number]  CC: Annual Exam (Pt states she has issues with snoring and legs being cold and tingling in both legs as well )   HPI Belinda Johnson presents for a well exam  C/o snoring - worse C/o cold feet The patient is complaining of hair loss  Outpatient Medications Prior to Visit  Medication Sig Dispense Refill   acetaminophen (TYLENOL) 650 MG CR tablet Take 650 mg by mouth every 8 (eight) hours as needed for pain.     Calcium Carbonate (CALCIUM 600 PO) Take by mouth daily.     cholecalciferol (VITAMIN D ) 1000 UNITS tablet Take 1,000 Units by mouth daily.     No facility-administered medications prior to visit.    ROS: Review of Systems  Constitutional:  Positive for unexpected weight change. Negative for activity change, appetite change, chills and fatigue.  HENT:  Negative for congestion, mouth sores and sinus pressure.   Eyes:  Negative for visual disturbance.  Respiratory:  Negative for cough and chest tightness.   Gastrointestinal:  Negative for abdominal pain and nausea.  Genitourinary:  Negative for difficulty urinating, frequency and vaginal pain.  Musculoskeletal:  Negative for back pain and gait problem.  Skin:  Negative for pallor and rash.  Neurological:  Negative for dizziness, tremors, weakness, numbness and headaches.  Psychiatric/Behavioral:  Negative for confusion, sleep disturbance and suicidal ideas.     Objective:  BP 118/78   Pulse 68   Temp 98.6 F (37 C) (Oral)   Ht 5' 2 (1.575 m)   Wt 189 lb (85.7 kg)   SpO2 98%   BMI 34.57 kg/m   BP Readings from Last 3 Encounters:  07/14/24 118/78  10/24/22 98/62  01/25/21 112/78    Wt Readings from Last 3 Encounters:  07/14/24 189 lb (85.7 kg)  10/24/22 190 lb (86.2 kg)  01/25/21 181 lb (82.1 kg)    Physical Exam Constitutional:      General: She is not in acute distress.    Appearance: She is  well-developed. She is obese.  HENT:     Head: Normocephalic.     Right Ear: External ear normal.     Left Ear: External ear normal.     Nose: Nose normal.  Eyes:     General:        Right eye: No discharge.        Left eye: No discharge.     Conjunctiva/sclera: Conjunctivae normal.     Pupils: Pupils are equal, round, and reactive to light.  Neck:     Thyroid : No thyromegaly.     Vascular: No JVD.     Trachea: No tracheal deviation.  Cardiovascular:     Rate and Rhythm: Normal rate and regular rhythm.     Heart sounds: Normal heart sounds.  Pulmonary:     Effort: No respiratory distress.     Breath sounds: No stridor. No wheezing.  Abdominal:     General: Bowel sounds are normal. There is no distension.     Palpations: Abdomen is soft. There is no mass.     Tenderness: There is no abdominal tenderness. There is no guarding or rebound.  Musculoskeletal:        General: No tenderness.     Cervical back: Normal range of motion and neck supple. No rigidity.  Lymphadenopathy:     Cervical: No cervical adenopathy.  Skin:  Findings: No erythema or rash.  Neurological:     Cranial Nerves: No cranial nerve deficit.     Motor: No abnormal muscle tone.     Coordination: Coordination normal.     Deep Tendon Reflexes: Reflexes normal.  Psychiatric:        Behavior: Behavior normal.        Thought Content: Thought content normal.        Judgment: Judgment normal.   Feet are warm bilaterally with good pulses.  No edema  Lab Results  Component Value Date   WBC 5.0 07/13/2024   HGB 13.1 07/13/2024   HCT 39.8 07/13/2024   PLT 244.0 07/13/2024   GLUCOSE 111 (H) 07/13/2024   CHOL 159 07/13/2024   TRIG 62.0 07/13/2024   HDL 62.20 07/13/2024   LDLCALC 84 07/13/2024   ALT 29 07/13/2024   AST 31 07/13/2024   NA 139 07/13/2024   K 4.4 07/13/2024   CL 104 07/13/2024   CREATININE 1.26 (H) 07/13/2024   BUN 12 07/13/2024   CO2 29 07/13/2024   TSH 2.42 07/13/2024    MM DIAG  BREAST TOMO BILATERAL Result Date: 09/19/2020 CLINICAL DATA:  Follow-up for probably benign LEFT breast asymmetry. EXAM: DIGITAL DIAGNOSTIC BILATERAL MAMMOGRAM WITH TOMO AND CAD COMPARISON:  Previous exams including most diagnostic mammograms dated 07/24/2020 and 09/17/2019 and screening mammogram dated 09/09/2019. ACR Breast Density Category b: There are scattered areas of fibroglandular density. FINDINGS: The previously demonstrated LEFT breast asymmetry is no longer seen, confirming benignity, presumably resolved cyst. There are no new dominant masses, suspicious calcifications or secondary signs of malignancy within either breast. Mammographic images were processed with CAD. IMPRESSION: No evidence of malignancy within either breast. Patient may return to routine annual bilateral screening mammogram schedule. RECOMMENDATION: Screening mammogram in one year.(Code:SM-B-01Y) I have discussed the findings and recommendations with the patient. If applicable, a reminder letter will be sent to the patient regarding the next appointment. BI-RADS CATEGORY  1: Negative. Electronically Signed   By: Lael Hines M.D.   On: 09/19/2020 14:09    Assessment & Plan:   Problem List Items Addressed This Visit     Hair loss   Obtain lab work including TSH Try Rogaine for women      Snoring - Primary   Sleep consult - r/o OSA Try chinstrap       Relevant Orders   Ambulatory referral to Pulmonology   Well adult exam    We discussed age appropriate health related issues, including available/recomended screening tests and vaccinations. Labs were ordered to be later reviewed . All questions were answered. We discussed one or more of the following - seat belt use, use of sunscreen/sun exposure exercise, safe sex, fall risk reduction, second hand smoke exposure, firearm use and storage, seat belt use, a need for adhering to healthy diet and exercise. Labs were ordered.  All questions were answered. Exercising  regularly Last colonoscopy 2020 PAP w/GYN Eye exam yearly Hot flashes          No orders of the defined types were placed in this encounter.     Follow-up: Return in about 6 months (around 01/14/2025) for a follow-up visit.  Marolyn Noel, MD

## 2024-07-14 NOTE — Assessment & Plan Note (Addendum)
 Sleep consult - r/o OSA Try chinstrap

## 2024-07-15 ENCOUNTER — Ambulatory Visit: Payer: Self-pay | Admitting: Internal Medicine

## 2024-07-19 ENCOUNTER — Encounter: Payer: Self-pay | Admitting: Internal Medicine

## 2024-07-19 DIAGNOSIS — L659 Nonscarring hair loss, unspecified: Secondary | ICD-10-CM | POA: Insufficient documentation

## 2024-07-19 NOTE — Assessment & Plan Note (Signed)
  We discussed age appropriate health related issues, including available/recomended screening tests and vaccinations. Labs were ordered to be later reviewed . All questions were answered. We discussed one or more of the following - seat belt use, use of sunscreen/sun exposure exercise, safe sex, fall risk reduction, second hand smoke exposure, firearm use and storage, seat belt use, a need for adhering to healthy diet and exercise. Labs were ordered.  All questions were answered. Exercising regularly Last colonoscopy 2020 PAP w/GYN Eye exam yearly Hot flashes

## 2024-07-19 NOTE — Assessment & Plan Note (Signed)
 Obtain lab work including TSH Try Rogaine for women

## 2024-09-10 ENCOUNTER — Ambulatory Visit: Admitting: Pulmonary Disease

## 2024-09-13 ENCOUNTER — Ambulatory Visit: Admitting: Pulmonary Disease

## 2024-09-13 ENCOUNTER — Encounter: Payer: Self-pay | Admitting: Pulmonary Disease

## 2024-10-19 ENCOUNTER — Ambulatory Visit: Payer: Self-pay

## 2024-10-19 NOTE — Telephone Encounter (Signed)
 FYI Only or Action Required?: FYI only for provider.  Patient was last seen in primary care on 07/14/2024 by Plotnikov, Karlynn GAILS, MD.  Called Nurse Triage reporting Foot Injury and Foot Pain.  Symptoms began several weeks ago.  Interventions attempted: Nothing.  Symptoms are: unchanged.  Triage Disposition: See PCP When Office is Open (Within 3 Days)  Patient/caregiver understands and will follow disposition?: Yes Appt for thursday                   Copied from CRM [id number]. Topic: Clinical - Red Word Triage >> Oct 19, 2024  8:02 AM Pinkey ORN wrote: Red Word that prompted transfer to Nurse Triage: Pain Underneath Feet Reason for Disposition  [1] MODERATE pain (e.g., interferes with normal activities, limping) AND [2] present > 3 days  Answer Assessment - Initial Assessment Questions 1. ONSET: When did the pain start?      2 weeks 2. LOCATION: Where is the pain located?      Bottom of feet 3. PAIN: How bad is the pain?    (Scale 1-10; or mild, moderate, severe)     moderate 4. WORK OR EXERCISE: Has there been any recent work or exercise that involved this part of the body?      no 5. CAUSE: What do you think is causing the foot pain?     no 6. OTHER SYMPTOMS: Do you have any other symptoms? (e.g., leg pain, rash, fever, numbness)     no  Protocols used: Foot Pain-A-AH

## 2024-10-21 ENCOUNTER — Encounter: Payer: Self-pay | Admitting: Internal Medicine

## 2024-10-21 ENCOUNTER — Ambulatory Visit: Admitting: Internal Medicine

## 2024-10-21 VITALS — BP 102/74 | HR 82 | Temp 98.6°F | Ht 62.0 in | Wt 192.4 lb

## 2024-10-21 DIAGNOSIS — G8929 Other chronic pain: Secondary | ICD-10-CM

## 2024-10-21 DIAGNOSIS — M79672 Pain in left foot: Secondary | ICD-10-CM | POA: Diagnosis not present

## 2024-10-21 DIAGNOSIS — M79671 Pain in right foot: Secondary | ICD-10-CM

## 2024-10-21 NOTE — Progress Notes (Signed)
 Subjective:  Patient ID: Belinda Johnson, female    DOB: 04-05-1966  Age: 58 y.o. MRN: [id number]  CC: Foot Pain (Foot pain radiating from soles of feet starting two weeks ago)   HPI Belinda Johnson presents for B feet pain L>B in the distal shins and feet, worse with exercising, jumping etc. Belinda Johnson is describing tingling in B calves, transiently in the Achilles tendons with exercise on both sides She is wondering about poor circulation The problem has been going on for several months  Outpatient Medications Prior to Visit  Medication Sig Dispense Refill   acetaminophen (TYLENOL) 650 MG CR tablet Take 650 mg by mouth every 8 (eight) hours as needed for pain.     Calcium Carbonate (CALCIUM 600 PO) Take by mouth daily.     cholecalciferol (VITAMIN D ) 1000 UNITS tablet Take 1,000 Units by mouth daily.     No facility-administered medications prior to visit.    ROS: Review of Systems  Constitutional:  Negative for activity change, appetite change, chills, fatigue and unexpected weight change.  HENT:  Negative for congestion, mouth sores and sinus pressure.   Eyes:  Negative for visual disturbance.  Respiratory:  Negative for cough and chest tightness.   Gastrointestinal:  Negative for abdominal pain and nausea.  Genitourinary:  Negative for difficulty urinating, frequency and vaginal pain.  Musculoskeletal:  Positive for arthralgias and gait problem. Negative for back pain.  Skin:  Negative for pallor and rash.  Neurological:  Negative for dizziness, tremors, weakness, numbness and headaches.  Psychiatric/Behavioral:  Negative for confusion and sleep disturbance.     Objective:  BP 102/74   Pulse 82   Temp 98.6 F (37 C)   Ht 5' 2 (1.575 m)   Wt 192 lb 6.4 oz (87.3 kg)   SpO2 98%   BMI 35.19 kg/m   BP Readings from Last 3 Encounters:  10/21/24 102/74  07/14/24 118/78  10/24/22 98/62    Wt Readings from Last 3 Encounters:  10/21/24 192 lb 6.4 oz (87.3 kg)   07/14/24 189 lb (85.7 kg)  10/24/22 190 lb (86.2 kg)    Physical Exam Constitutional:      General: She is not in acute distress.    Appearance: She is well-developed.  HENT:     Head: Normocephalic.     Right Ear: External ear normal.     Left Ear: External ear normal.     Nose: Nose normal.  Eyes:     General:        Right eye: No discharge.        Left eye: No discharge.     Conjunctiva/sclera: Conjunctivae normal.     Pupils: Pupils are equal, round, and reactive to light.  Neck:     Thyroid : No thyromegaly.     Vascular: No JVD.     Trachea: No tracheal deviation.  Cardiovascular:     Rate and Rhythm: Normal rate and regular rhythm.     Heart sounds: Normal heart sounds.  Pulmonary:     Effort: No respiratory distress.     Breath sounds: No stridor. No wheezing.  Abdominal:     General: Bowel sounds are normal. There is no distension.     Palpations: Abdomen is soft. There is no mass.     Tenderness: There is no abdominal tenderness. There is no guarding or rebound.  Musculoskeletal:        General: Tenderness present.     Cervical back: Normal  range of motion and neck supple. No rigidity.  Lymphadenopathy:     Cervical: No cervical adenopathy.  Skin:    Findings: No erythema or rash.  Neurological:     Cranial Nerves: No cranial nerve deficit.     Motor: No abnormal muscle tone.     Coordination: Coordination normal.     Deep Tendon Reflexes: Reflexes normal.  Psychiatric:        Behavior: Behavior normal.        Thought Content: Thought content normal.        Judgment: Judgment normal.   Legs and feet without deformity Falling arches bilaterally Tension and discomfort over Achilles tendons Calves nontender, normal skin, normal pulses normal sensation.  No edema  Lab Results  Component Value Date   WBC 5.0 07/13/2024   HGB 13.1 07/13/2024   HCT 39.8 07/13/2024   PLT 244.0 07/13/2024   GLUCOSE 111 (H) 07/13/2024   CHOL 159 07/13/2024   TRIG 62.0  07/13/2024   HDL 62.20 07/13/2024   LDLCALC 84 07/13/2024   ALT 29 07/13/2024   AST 31 07/13/2024   NA 139 07/13/2024   K 4.4 07/13/2024   CL 104 07/13/2024   CREATININE 1.26 (H) 07/13/2024   BUN 12 07/13/2024   CO2 29 07/13/2024   TSH 2.42 07/13/2024    MM DIAG BREAST TOMO BILATERAL Result Date: 09/19/2020 CLINICAL DATA:  Follow-up for probably benign LEFT breast asymmetry. EXAM: DIGITAL DIAGNOSTIC BILATERAL MAMMOGRAM WITH TOMO AND CAD COMPARISON:  Previous exams including most diagnostic mammograms dated 07/24/2020 and 09/17/2019 and screening mammogram dated 09/09/2019. ACR Breast Density Category b: There are scattered areas of fibroglandular density. FINDINGS: The previously demonstrated LEFT breast asymmetry is no longer seen, confirming benignity, presumably resolved cyst. There are no new dominant masses, suspicious calcifications or secondary signs of malignancy within either breast. Mammographic images were processed with CAD. IMPRESSION: No evidence of malignancy within either breast. Patient may return to routine annual bilateral screening mammogram schedule. RECOMMENDATION: Screening mammogram in one year.(Code:SM-B-01Y) I have discussed the findings and recommendations with the patient. If applicable, a reminder letter will be sent to the patient regarding the next appointment. BI-RADS CATEGORY  1: Negative. Electronically Signed   By: Belinda Johnson M.D.   On: 09/19/2020 14:09    Assessment & Plan:   Problem List Items Addressed This Visit   None     No orders of the defined types were placed in this encounter.     Follow-up: No follow-ups on file.  Marolyn Noel, MD

## 2024-10-21 NOTE — Patient Instructions (Addendum)
 Spenco arch supports  HOKA

## 2024-10-21 NOTE — Assessment & Plan Note (Signed)
 Worse.  Legs and feet without deformity Falling arches bilaterally Tension and discomfort over Achilles tendons Calves nontender, normal skin, normal pulses normal sensation.  No edema  We discussed options.  Obtain sports medicine consultation.  She will get [place] sneakers or arch supports. She can use flip-flops with good arch support at home [place] as needed

## 2024-11-15 ENCOUNTER — Ambulatory Visit (HOSPITAL_BASED_OUTPATIENT_CLINIC_OR_DEPARTMENT_OTHER)

## 2024-11-15 ENCOUNTER — Ambulatory Visit (HOSPITAL_BASED_OUTPATIENT_CLINIC_OR_DEPARTMENT_OTHER): Admitting: Pulmonary Disease

## 2024-12-31 ENCOUNTER — Ambulatory Visit (INDEPENDENT_AMBULATORY_CARE_PROVIDER_SITE_OTHER): Admitting: Pulmonary Disease

## 2024-12-31 ENCOUNTER — Encounter (HOSPITAL_BASED_OUTPATIENT_CLINIC_OR_DEPARTMENT_OTHER): Payer: Self-pay | Admitting: Pulmonary Disease

## 2024-12-31 VITALS — BP 111/79 | HR 82 | Ht 62.0 in | Wt 193.0 lb

## 2024-12-31 DIAGNOSIS — E663 Overweight: Secondary | ICD-10-CM

## 2024-12-31 DIAGNOSIS — R0683 Snoring: Secondary | ICD-10-CM | POA: Diagnosis not present

## 2024-12-31 DIAGNOSIS — Z6835 Body mass index (BMI) 35.0-35.9, adult: Secondary | ICD-10-CM

## 2024-12-31 DIAGNOSIS — R519 Headache, unspecified: Secondary | ICD-10-CM | POA: Diagnosis not present

## 2024-12-31 DIAGNOSIS — G4733 Obstructive sleep apnea (adult) (pediatric): Secondary | ICD-10-CM

## 2024-12-31 DIAGNOSIS — R0681 Apnea, not elsewhere classified: Secondary | ICD-10-CM

## 2024-12-31 NOTE — Progress Notes (Signed)
 "  New Patient Pulmonology Office Visit   Subjective:  Patient ID: Belinda Johnson, female    DOB: December 06, 1966  MRN: [id number]  Referred by: Garald Karlynn GAILS, MD  CC: No chief complaint on file.   Discussed the use of AI scribe software for clinical note transcription with the patient, who gave verbal consent to proceed.  History of Present Illness Belinda Johnson is a 59 year old female who presents with sleep disturbances and suspected sleep apnea. She was referred by Dr. Florencia for evaluation of sleep disturbances and suspected sleep apnea.  She has had loud snoring and witnessed apneas during sleep for several years with progressive worsening. She started a dental device 2 weeks ago that she feels helps, but it causes morning throat dryness.  Her sleep schedule is irregular, with bedtime between 9 PM and 1 AM and wake time around 6:30 AM. She usually feels rested, with occasional mild morning headaches that resolve without medication. She drinks most of her fluids in the evening and has nocturia up to 3 times nightly, which is the main disruption to her sleep. She prefers side sleeping, especially on the left, on an elevated wedge. She does not have difficulty falling asleep and has daytime sleepiness only when she goes to bed late.  Her weight has increased from 175 lb to 193 lb over the years. She is not on any medications.   ESS 13  ROS  Constitutional: negative for anorexia, fevers and sweats  Eyes: negative for irritation, redness and visual disturbance  Ears, nose, mouth, throat, and face: negative for earaches, epistaxis, nasal congestion and sore throat  Respiratory: negative for cough, dyspnea on exertion, sputum and wheezing  Cardiovascular: negative for chest pain, dyspnea, lower extremity edema, orthopnea, palpitations and syncope  Gastrointestinal: negative for abdominal pain, constipation, diarrhea, melena, nausea and vomiting  Genitourinary:negative for  dysuria, frequency and hematuria  Hematologic/lymphatic: negative for bleeding, easy bruising and lymphadenopathy  Musculoskeletal:negative for arthralgias, muscle weakness and stiff joints  Neurological: negative for coordination problems, gait problems, headaches and weakness  Endocrine: negative for diabetic symptoms including polydipsia, polyuria and weight loss  Allergies: Dust mite extract, Latex, Quinine derivatives, and Quinine derivatives Current Medications[1] Past Medical History:  Diagnosis Date   Allergic rhinitis    DUB (dysfunctional uterine bleeding) 2011   H/O varicella    H/O: menorrhagia 11/18/11   Irregular periods/menstrual cycles 2011   LBP (low back pain)    Ovarian cyst, right 2011   Past Surgical History:  Procedure Laterality Date   procedure with sedation when pregnant     20 yr ago per pt   Family History  Problem Relation Age of Onset   Osteoarthritis Mother    Hypertension Mother    Hypertension Other    Colon cancer Neg Hx    Colon polyps Neg Hx    Esophageal cancer Neg Hx    Rectal cancer Neg Hx    Stomach cancer Neg Hx    Social History   Socioeconomic History   Marital status: Married    Spouse name: Not on file   Number of children: 4   Years of education: Not on file   Highest education level: Not on file  Occupational History   Occupation: Occupational Hygienist at [employer]  Tobacco Use   Smoking status: Never   Smokeless tobacco: Never  Vaping Use   Vaping status: Never Used  Substance and Sexual Activity   Alcohol use: Not Currently  Alcohol/week: 1.0 standard drink of alcohol    Types: 1 Glasses of wine per week   Drug use: No   Sexual activity: Yes  Other Topics Concern   Not on file  Social History Narrative   ** Merged History Encounter **       Social Drivers of Health   Tobacco Use: Low Risk (10/21/2024)   Patient History    Smoking Tobacco Use: Never    Smokeless Tobacco Use: Never    Passive Exposure: Not on file   Financial Resource Strain: Not on file  Food Insecurity: Not on file  Transportation Needs: Not on file  Physical Activity: Not on file  Stress: Not on file  Social Connections: Not on file  Intimate Partner Violence: Not on file  Depression (PHQ2-9): Low Risk (07/14/2024)   Depression (PHQ2-9)    PHQ-2 Score: 0  Alcohol Screen: Not on file  Housing: Not on file  Utilities: Not on file  Health Literacy: Not on file       Objective:  There were no vitals taken for this visit.   Physical Exam  Gen. Pleasant, obese, in no distress ENT - no lesions, no post nasal drip Neck: No JVD, no thyromegaly, no carotid bruits Lungs: no use of accessory muscles, no dullness to percussion, decreased without rales or rhonchi  Cardiovascular: Rhythm regular, heart sounds  normal, no murmurs or gallops, no peripheral edema Musculoskeletal: No deformities, no cyanosis or clubbing , no tremors       Assessment & Plan:  Assessment and Plan Assessment & Plan Obstructive sleep apnea (suspected) Suspected obstructive sleep apnea with symptoms of snoring, witnessed apneas, and daytime headaches. Reports improvement with a dental device but experiences dry throat upon waking. Prefers to avoid CPAP due to inconvenience. Explained pathophysiology, including airway obstruction during sleep, oxygen desaturation, and cardiovascular stress. Emphasized importance of sleep study to determine severity and guide treatment. - Ordered home sleep study to assess for obstructive sleep apnea. - Discussed potential treatment options based on sleep study results, including CPAP and custom dental device. - Advised on weight loss and breathing exercises as adjunctive measures.  Overweight Current weight of 193 lbs, increased from ideal weight of 175 lbs. Discussed impact of weight on sleep apnea and overall health. Encouraged weight loss as a means to improve sleep apnea symptoms and general health. - Encouraged  weight loss through diet and exercise. - Discussed potential benefits of weight loss on sleep apnea and overall health.       No follow-ups on file.   Harden ROCKFORD Jude, MD     [1]  Current Outpatient Medications:    acetaminophen (TYLENOL) 650 MG CR tablet, Take 650 mg by mouth every 8 (eight) hours as needed for pain., Disp: , Rfl:    Calcium Carbonate (CALCIUM 600 PO), Take by mouth daily., Disp: , Rfl:    cholecalciferol (VITAMIN D ) 1000 UNITS tablet, Take 1,000 Units by mouth daily., Disp: , Rfl:   "

## 2024-12-31 NOTE — Progress Notes (Signed)
 Epworth Sleepiness Scale  Use the following scale to choose the most appropriate number for each situation. 0 Would never nod off 1  Slight  chance of nodding off 2 Moderate chance of nodding off 3 High chance of nodding off  Sitting and reading: 2 Watching TV: 3 Sitting, inactive, in a public place (e.g., in a meeting, theater, or dinner event): 2 As a passenger in a car for an hour or more without stopping for a break: 1 Lying down to rest when circumstances permit:3 Sitting and talking to someone: 0 Sitting quietly after a meal without alcohol: 2 In a car, while stopped for a few  minutes in traffic or at a light: 0  TOTOAL: 13

## 2024-12-31 NOTE — Patient Instructions (Signed)
" °  VISIT SUMMARY: Today, you were seen for sleep disturbances and suspected sleep apnea. You have been experiencing loud snoring and witnessed apneas during sleep for several years, which have progressively worsened. You have started using a dental device that helps but causes morning throat dryness. Your sleep schedule is irregular, and you experience nocturia up to 3 times nightly, which disrupts your sleep. You also have occasional mild morning headaches and have gained weight over the years.  YOUR PLAN: -OBSTRUCTIVE SLEEP APNEA (SUSPECTED): Obstructive sleep apnea is a condition where the airway becomes blocked during sleep, leading to breathing pauses, oxygen desaturation, and cardiovascular stress. You have symptoms such as snoring, witnessed apneas, and morning headaches. We have ordered a home sleep study to assess the severity of your condition. Based on the results, we will discuss potential treatment options, including CPAP and a custom dental device. Additionally, we recommend weight loss and breathing exercises to help manage your symptoms.  -OVERWEIGHT: Being overweight can contribute to sleep apnea and affect your overall health. Your current weight is 193 lbs, which has increased from your ideal weight of 175 lbs. We encourage you to lose weight through diet and exercise, as this can improve your sleep apnea symptoms and benefit your general health.  INSTRUCTIONS: Please complete the home sleep study as ordered. Follow up with us  to discuss the results and potential treatment options. Focus on weight loss through a healthy diet and regular exercise. If you have any questions or concerns, do not hesitate to contact our office.                      Contains text generated by [employer].                                 Contains text generated by [employer].   "

## 2025-01-20 NOTE — Progress Notes (Unsigned)
"             ° °   [facility] [address] [place] [address] Phone: [phone]   Assessment and Plan:     ***    Pertinent previous records reviewed include ***   Follow Up: ***     Subjective:   I, Belinda Johnson, am serving as a neurosurgeon for Belinda Johnson  Chief Complaint: bilat foot pain   HPI:   01/21/2025 Patient is a 59 year old female with bilat foot pain. Patient states    Relevant Historical Information: ***  Additional pertinent review of systems negative.  Current Medications[1]   Objective:     There were no vitals filed for this visit.    There is no height or weight on file to calculate BMI.    Physical Exam:    ***   Electronically signed by:  [facility] 7:27 AM 01/20/25    [1]  Current Outpatient Medications:    acetaminophen (TYLENOL) 650 MG CR tablet, Take 650 mg by mouth every 8 (eight) hours as needed for pain., Disp: , Rfl:    Calcium Carbonate (CALCIUM 600 PO), Take by mouth daily., Disp: , Rfl:    cholecalciferol (VITAMIN D ) 1000 UNITS tablet, Take 1,000 Units by mouth daily., Disp: , Rfl:   "

## 2025-01-21 ENCOUNTER — Ambulatory Visit: Admitting: Sports Medicine

## 2025-01-24 ENCOUNTER — Ambulatory Visit: Admitting: Sports Medicine

## 2025-02-07 ENCOUNTER — Ambulatory Visit: Admitting: Sports Medicine

## 2025-02-14 ENCOUNTER — Ambulatory Visit: Admitting: Sports Medicine
